# Patient Record
Sex: Female | Born: 1990 | Race: Black or African American | Hispanic: No | Marital: Single | Smoking: Current every day smoker
Health system: Southern US, Community
[De-identification: ages and names within clinical notes are randomized; demographics above are authoritative.]

## PROBLEM LIST (undated history)

## (undated) DIAGNOSIS — W3400XA Accidental discharge from unspecified firearms or gun, initial encounter: Secondary | ICD-10-CM

## (undated) DIAGNOSIS — Z8614 Personal history of Methicillin resistant Staphylococcus aureus infection: Secondary | ICD-10-CM

## (undated) HISTORY — PX: OTHER SURGICAL HISTORY: SHX169

---
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
## 2011-04-27 LAB — CBC
HCT: 32.1 — ABNORMAL LOW
Hemoglobin: 10 — ABNORMAL LOW
MCHC: 34.9
MCV: 80.9
Platelets: 243
RDW: 13
RDW: 13.2
WBC: 10.1

## 2011-04-27 LAB — RPR: RPR Ser Ql: NONREACTIVE

## 2011-05-11 LAB — URINALYSIS, ROUTINE W REFLEX MICROSCOPIC
Nitrite: NEGATIVE
Protein, ur: NEGATIVE
Specific Gravity, Urine: 1.01
Urobilinogen, UA: 0.2

## 2011-05-11 LAB — DIFFERENTIAL
Eosinophils Absolute: 0.2
Eosinophils Relative: 2
Lymphs Abs: 1.9
Monocytes Absolute: 0.5
Monocytes Relative: 6

## 2011-05-11 LAB — SICKLE CELL SCREEN: Sickle Cell Screen: POSITIVE — AB

## 2011-05-11 LAB — URINE MICROSCOPIC-ADD ON

## 2011-05-11 LAB — CBC
HCT: 33.1
Hemoglobin: 11.6
MCV: 88
RBC: 3.75 — ABNORMAL LOW
WBC: 8.7

## 2011-05-11 LAB — URINE CULTURE: Culture: NO GROWTH

## 2011-05-11 LAB — ABO/RH: ABO/RH(D): O POS

## 2011-05-11 LAB — GC/CHLAMYDIA PROBE AMP, GENITAL
Chlamydia, DNA Probe: POSITIVE — AB
GC Probe Amp, Genital: NEGATIVE

## 2011-05-11 LAB — TYPE AND SCREEN: ABO/RH(D): O POS

## 2011-05-11 LAB — WET PREP, GENITAL
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
## 2011-10-30 ENCOUNTER — Encounter (HOSPITAL_COMMUNITY): Payer: Self-pay | Admitting: Emergency Medicine

## 2011-10-30 ENCOUNTER — Emergency Department (HOSPITAL_COMMUNITY)
Admission: EM | Admit: 2011-10-30 | Discharge: 2011-10-31 | Disposition: A | Discharge status: Home / Self Care | Payer: Self-pay | Attending: Emergency Medicine | Admitting: Emergency Medicine

## 2011-10-30 DIAGNOSIS — R197 Diarrhea, unspecified: Secondary | ICD-10-CM | POA: Insufficient documentation

## 2011-10-30 DIAGNOSIS — R5381 Other malaise: Secondary | ICD-10-CM | POA: Insufficient documentation

## 2011-10-30 DIAGNOSIS — R112 Nausea with vomiting, unspecified: Secondary | ICD-10-CM | POA: Insufficient documentation

## 2011-10-30 DIAGNOSIS — F17200 Nicotine dependence, unspecified, uncomplicated: Secondary | ICD-10-CM | POA: Insufficient documentation

## 2011-10-30 HISTORY — DX: Accidental discharge from unspecified firearms or gun, initial encounter: W34.00XA

## 2011-10-30 LAB — CBC
MCH: 29.2 pg (ref 26.0–34.0)
MCHC: 35.9 g/dL (ref 30.0–36.0)
MCV: 81.2 fL (ref 78.0–100.0)
Platelets: 316 10*3/uL (ref 150–400)
RDW: 13.7 % (ref 11.5–15.5)
WBC: 9 10*3/uL (ref 4.0–10.5)

## 2011-10-30 LAB — BASIC METABOLIC PANEL
Calcium: 9.6 mg/dL (ref 8.4–10.5)
GFR calc non Af Amer: >90 mL/min (ref >90)
Sodium: 140 mEq/L (ref 135–145)

## 2011-10-30 LAB — DIFFERENTIAL
Basophils Absolute: 0.1 10*3/uL (ref 0.0–0.1)
Basophils Relative: 1 % (ref 0–1)
Eosinophils Absolute: 0.1 10*3/uL (ref 0.0–0.7)
Eosinophils Relative: 1 % (ref 0–5)

## 2011-10-30 NOTE — ED Notes (Signed)
PT. REPORTS EMESIS AND DIARRHEA WITH SLIGHT SOB ONSET TODAY WITH GENERALIZED WEAKNESS AND CHILLS.

## 2011-10-31 ENCOUNTER — Encounter (HOSPITAL_COMMUNITY): Payer: Self-pay | Admitting: Emergency Medicine

## 2011-10-31 LAB — URINALYSIS, ROUTINE W REFLEX MICROSCOPIC
Ketones, ur: 15 mg/dL — AB
Protein, ur: NEGATIVE mg/dL
Urobilinogen, UA: 0.2 mg/dL (ref 0.0–1.0)

## 2011-10-31 LAB — POCT PREGNANCY, URINE: Preg Test, Ur: NEGATIVE

## 2011-10-31 LAB — HEPATIC FUNCTION PANEL
ALT: 15 U/L (ref 0–35)
Bilirubin, Direct: 0.1 mg/dL (ref 0.0–0.3)
Indirect Bilirubin: 0.3 mg/dL (ref 0.3–0.9)
Total Bilirubin: 0.4 mg/dL (ref 0.3–1.2)

## 2011-10-31 LAB — URINE MICROSCOPIC-ADD ON

## 2011-10-31 MED ORDER — ONDANSETRON HCL 4 MG PO TABS: 4.0000 mg | ORAL_TABLET | Freq: Four times a day (QID) | ORAL | Status: AC | PRN

## 2011-10-31 NOTE — ED Notes (Signed)
Waiting for lab work
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
## 2011-10-31 NOTE — Discharge Instructions (Signed)
RESOURCE GUIDE  Dental Problems  Patients with Medicaid: [facility]                     [facility] [address]                                           [address] Phone:  [phone]                                                  Phone:  [phone]  If unable to pay or uninsured, contact:  [facility] or [facility] to become qualified for the adult dental clinic.  Chronic Pain Problems Contact [facility]  [phone] Patients need to be referred by their primary care doctor.  Insufficient Money for Medicine Contact [employer]:  call "211" or [facility] 271-5999.  No Primary Care Doctor Call [facility]  832-8000 Other agencies that provide inexpensive medical care    [facility]  [phone]    [facility]  [phone]    [facility]  [phone]    Women[facility] Clinic  [phone]    [facility]  [phone]    [facility]  [phone]  Psychological Services [facility]  [phone] [facility]  [phone] [facility]   [phone] (emergency services [phone])  Substance Abuse Resources [facility]  336-882-2125 [facility] 336-784-9470 [facility] 336-285-9073 [facility] 336-845-3988 Residential & Outpatient Substance Abuse Program  800-659-3381  Abuse/Neglect [place] Child Abuse Hotline (336) 641-3795 [place] Child Abuse Hotline 800-378-5315 (After Hours)  Emergency Shelter [facility] (336) 271-5985  Maternity Homes [facility] (336) 275-9566 [facility] (704) 372-4663  MRSA Hotline #:   832-7006    [place] [facility]  [facility] [place] [facility]     [employer]                          [facility] [address] [place]                       [address]      [address]  [place]                                                [place]                            [place] Phone:  [phone]                                   Phone:  [phone]                 Phone:  [phone]  [facility] Phone:  [phone]    [facility] Child Abuse Hotline (364)866-4343 431-789-9228 (After Hours)   Take the prescription as directed.  Increase your fluid intake (ie:  Gatoraide) for the next few days, as discussed.  Eat a bland diet and advance to your regular diet slowly as you can tolerate it.   Avoid full strength juices, as well as milk and milk products until your diarrhea has resolved.   Call your regular medical doctor and your OB/GYN doctor on Monday to schedule a follow up appointment this week.  Return to the Emergency Department immediately if not improving (or even worsening) despite taking the medicines as prescribed, any black or bloody stool or vomit, if you develop a fever, or for any other concerns.
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
## 2012-12-11 ENCOUNTER — Emergency Department (HOSPITAL_COMMUNITY)
Admission: EM | Admit: 2012-12-11 | Discharge: 2012-12-12 | Disposition: A | Discharge status: Home / Self Care | Payer: Medicaid Other | Attending: Emergency Medicine | Admitting: Emergency Medicine

## 2012-12-11 DIAGNOSIS — J029 Acute pharyngitis, unspecified: Secondary | ICD-10-CM | POA: Insufficient documentation

## 2012-12-11 DIAGNOSIS — Z3202 Encounter for pregnancy test, result negative: Secondary | ICD-10-CM | POA: Insufficient documentation

## 2012-12-11 DIAGNOSIS — F17200 Nicotine dependence, unspecified, uncomplicated: Secondary | ICD-10-CM | POA: Insufficient documentation

## 2012-12-11 DIAGNOSIS — Z87828 Personal history of other (healed) physical injury and trauma: Secondary | ICD-10-CM | POA: Insufficient documentation
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
## 2012-12-12 LAB — URINE MICROSCOPIC-ADD ON

## 2012-12-12 LAB — URINALYSIS, ROUTINE W REFLEX MICROSCOPIC
Bilirubin Urine: NEGATIVE
Glucose, UA: NEGATIVE mg/dL
Ketones, ur: NEGATIVE mg/dL
Leukocytes, UA: NEGATIVE
Nitrite: NEGATIVE
Protein, ur: NEGATIVE mg/dL
Specific Gravity, Urine: 1.026 (ref 1.005–1.030)
Urobilinogen, UA: 1 mg/dL (ref 0.0–1.0)
pH: 6 (ref 5.0–8.0)

## 2012-12-12 LAB — RAPID STREP SCREEN (MED CTR MEBANE ONLY): Streptococcus, Group A Screen (Direct): NEGATIVE

## 2012-12-12 LAB — PREGNANCY, URINE: Preg Test, Ur: NEGATIVE

## 2012-12-12 MED ORDER — IBUPROFEN 800 MG PO TABS
800.0000 mg | ORAL_TABLET | Freq: Once | ORAL | Status: AC
  Administered 2012-12-12: 800 mg via ORAL
  Filled 2012-12-12: qty 1

## 2012-12-12 MED ORDER — ACETAMINOPHEN-CODEINE 120-12 MG/5ML PO SOLN: 10.0000 mL | ORAL | Status: DC | PRN

## 2012-12-12 MED ORDER — SULFAMETHOXAZOLE-TRIMETHOPRIM 800-160 MG PO TABS: 2.0000 | ORAL_TABLET | Freq: Two times a day (BID) | ORAL | Status: DC

## 2012-12-12 NOTE — ED Provider Notes (Signed)
Medical screening examination/treatment/procedure(s) were performed by non-physician practitioner and as supervising physician I was immediately available for consultation/collaboration.   Jamirah Zelaya L Alyha Marines, MD 12/12/12 0709 

## 2013-01-10 ENCOUNTER — Encounter (HOSPITAL_COMMUNITY): Payer: Self-pay | Admitting: Emergency Medicine

## 2013-01-10 ENCOUNTER — Emergency Department (HOSPITAL_COMMUNITY): Payer: Medicaid Other

## 2013-01-10 ENCOUNTER — Emergency Department (HOSPITAL_COMMUNITY)
Admission: EM | Admit: 2013-01-10 | Discharge: 2013-01-10 | Disposition: A | Discharge status: Home / Self Care | Payer: Medicaid Other | Attending: Emergency Medicine | Admitting: Emergency Medicine

## 2013-01-10 DIAGNOSIS — S6990XA Unspecified injury of unspecified wrist, hand and finger(s), initial encounter: Secondary | ICD-10-CM | POA: Insufficient documentation

## 2013-01-10 DIAGNOSIS — Y929 Unspecified place or not applicable: Secondary | ICD-10-CM | POA: Insufficient documentation

## 2013-01-10 DIAGNOSIS — S6980XA Other specified injuries of unspecified wrist, hand and finger(s), initial encounter: Secondary | ICD-10-CM | POA: Insufficient documentation

## 2013-01-10 DIAGNOSIS — Z8614 Personal history of Methicillin resistant Staphylococcus aureus infection: Secondary | ICD-10-CM | POA: Insufficient documentation

## 2013-01-10 DIAGNOSIS — Y939 Activity, unspecified: Secondary | ICD-10-CM | POA: Insufficient documentation

## 2013-01-10 DIAGNOSIS — Z87828 Personal history of other (healed) physical injury and trauma: Secondary | ICD-10-CM | POA: Insufficient documentation

## 2013-01-10 DIAGNOSIS — S6992XA Unspecified injury of left wrist, hand and finger(s), initial encounter: Secondary | ICD-10-CM

## 2013-01-10 DIAGNOSIS — W230XXA Caught, crushed, jammed, or pinched between moving objects, initial encounter: Secondary | ICD-10-CM | POA: Insufficient documentation

## 2013-01-10 DIAGNOSIS — F17200 Nicotine dependence, unspecified, uncomplicated: Secondary | ICD-10-CM | POA: Insufficient documentation

## 2013-01-10 MED ORDER — HYDROCODONE-ACETAMINOPHEN 5-325 MG PO TABS
1.0000 | ORAL_TABLET | ORAL | Status: DC | PRN
Start: 2013-01-10 — End: 2013-03-15

## 2013-01-10 MED ORDER — HYDROCODONE-ACETAMINOPHEN 5-325 MG PO TABS
1.0000 | ORAL_TABLET | Freq: Once | ORAL | Status: AC
  Administered 2013-01-10: 1 via ORAL
  Filled 2013-01-10: qty 1

## 2013-01-10 NOTE — ED Notes (Signed)
Bacitracin and band-aid applied to wound. Pt instructed on wound care.
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
## 2013-01-11 NOTE — ED Provider Notes (Signed)
Medical screening examination/treatment/procedure(s) were performed by non-physician practitioner and as supervising physician I was immediately available for consultation/collaboration.  Toy Baker, MD 01/11/13 1311

## 2013-03-15 ENCOUNTER — Encounter (HOSPITAL_COMMUNITY): Payer: Self-pay | Admitting: Emergency Medicine

## 2013-03-15 ENCOUNTER — Other Ambulatory Visit (HOSPITAL_COMMUNITY)
Admission: RE | Admit: 2013-03-15 | Discharge: 2013-03-15 | Disposition: A | Discharge status: Home / Self Care | Payer: Medicaid Other | Source: Ambulatory Visit | Attending: Emergency Medicine | Admitting: Emergency Medicine

## 2013-03-15 ENCOUNTER — Emergency Department (HOSPITAL_COMMUNITY)
Admission: EM | Admit: 2013-03-15 | Discharge: 2013-03-15 | Disposition: A | Discharge status: Nursing Home (Includes ICF) | Payer: Medicaid Other | Source: Home / Self Care | Attending: Emergency Medicine | Admitting: Emergency Medicine

## 2013-03-15 DIAGNOSIS — Y939 Activity, unspecified: Secondary | ICD-10-CM | POA: Insufficient documentation

## 2013-03-15 DIAGNOSIS — W3400XA Accidental discharge from unspecified firearms or gun, initial encounter: Secondary | ICD-10-CM | POA: Insufficient documentation

## 2013-03-15 DIAGNOSIS — Z113 Encounter for screening for infections with a predominantly sexual mode of transmission: Secondary | ICD-10-CM | POA: Insufficient documentation

## 2013-03-15 DIAGNOSIS — N949 Unspecified condition associated with female genital organs and menstrual cycle: Secondary | ICD-10-CM

## 2013-03-15 DIAGNOSIS — Y929 Unspecified place or not applicable: Secondary | ICD-10-CM | POA: Insufficient documentation

## 2013-03-15 DIAGNOSIS — F17200 Nicotine dependence, unspecified, uncomplicated: Secondary | ICD-10-CM | POA: Insufficient documentation

## 2013-03-15 DIAGNOSIS — T148XXA Other injury of unspecified body region, initial encounter: Secondary | ICD-10-CM | POA: Insufficient documentation

## 2013-03-15 DIAGNOSIS — Z8614 Personal history of Methicillin resistant Staphylococcus aureus infection: Secondary | ICD-10-CM | POA: Insufficient documentation

## 2013-03-15 DIAGNOSIS — Z79899 Other long term (current) drug therapy: Secondary | ICD-10-CM | POA: Insufficient documentation

## 2013-03-15 DIAGNOSIS — R102 Pelvic and perineal pain: Secondary | ICD-10-CM

## 2013-03-15 DIAGNOSIS — J029 Acute pharyngitis, unspecified: Secondary | ICD-10-CM

## 2013-03-15 DIAGNOSIS — N760 Acute vaginitis: Secondary | ICD-10-CM | POA: Insufficient documentation

## 2013-03-15 DIAGNOSIS — L089 Local infection of the skin and subcutaneous tissue, unspecified: Secondary | ICD-10-CM | POA: Insufficient documentation

## 2013-03-15 DIAGNOSIS — S40821A Blister (nonthermal) of right upper arm, initial encounter: Secondary | ICD-10-CM

## 2013-03-15 DIAGNOSIS — S40229A Blister (nonthermal) of unspecified shoulder, initial encounter: Secondary | ICD-10-CM

## 2013-03-15 HISTORY — DX: Personal history of Methicillin resistant Staphylococcus aureus infection: Z86.14

## 2013-03-15 MED ORDER — LIDOCAINE HCL (PF) 1 % IJ SOLN
INTRAMUSCULAR | Status: AC
  Filled 2013-03-15: qty 5

## 2013-03-15 MED ORDER — CEFTRIAXONE SODIUM 250 MG IJ SOLR
INTRAMUSCULAR | Status: AC
  Filled 2013-03-15: qty 250

## 2013-03-15 MED ORDER — AZITHROMYCIN 250 MG PO TABS
ORAL_TABLET | ORAL | Status: AC
  Filled 2013-03-15: qty 4

## 2013-03-15 MED ORDER — CEFTRIAXONE SODIUM 250 MG IJ SOLR
250.0000 mg | Freq: Once | INTRAMUSCULAR | Status: AC
  Administered 2013-03-15: 250 mg via INTRAMUSCULAR

## 2013-03-15 MED ORDER — AZITHROMYCIN 250 MG PO TABS
1000.0000 mg | ORAL_TABLET | Freq: Once | ORAL | Status: AC
  Administered 2013-03-15: 1000 mg via ORAL
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
## 2013-03-15 NOTE — ED Notes (Signed)
Patient has multiple complaints.  Patient reports sore throat for 2 days, patient concerned for std.  Reports abdominal pain and vaginal discharge.  Patient also concerned for right upper arm infection.  Reports finished ?septra? Last week.  Reports she is supposed to have another surgery in regards to right upper arm/mrsa infection.

## 2013-03-15 NOTE — ED Notes (Signed)
Patient is aware of post injection delay prior to discharge.  

## 2013-03-15 NOTE — ED Notes (Signed)
PT. REPORTS SORE THROAT . VAGINAL DISCHARGE / PELVIC DISCOMFORT AND RIGHT UPPER ARM WOUND INFECTION WITH DRAINAGE , SEEN AT [facility] TODAY - NEGATIVE STREP SCREEN / RECEIVED ROCEPHIN 250 MG IM AND ZITHROMAX 1 GRAM PO AT URGENT CARE , SENT HERE FOR FURTHER EVALUATION OF RIGHT UPPER ARM WOUND INFECTION .

## 2013-03-15 NOTE — ED Notes (Signed)
NURSE FIRST ROUNDS : NURSE EXPLAINED DELAY , WAIT TIME AND PROCESS TO PT. DENIES PAIN AT THIS TIME / RESPIRATIONS UNLABORED.

## 2013-03-16 ENCOUNTER — Telehealth (HOSPITAL_COMMUNITY): Payer: Self-pay | Admitting: *Deleted

## 2013-03-16 ENCOUNTER — Emergency Department (HOSPITAL_COMMUNITY): Payer: Medicaid Other

## 2013-03-16 ENCOUNTER — Emergency Department (HOSPITAL_COMMUNITY)
Admission: EM | Admit: 2013-03-16 | Discharge: 2013-03-16 | Disposition: A | Discharge status: Home / Self Care | Payer: Medicaid Other | Attending: Emergency Medicine | Admitting: Emergency Medicine

## 2013-03-16 DIAGNOSIS — IMO0002 Reserved for concepts with insufficient information to code with codable children: Secondary | ICD-10-CM

## 2013-03-16 LAB — CBC WITH DIFFERENTIAL/PLATELET
Hemoglobin: 14.1 g/dL (ref 12.0–15.0)
Lymphocytes Relative: 38 % (ref 12–46)
Lymphs Abs: 3.8 10*3/uL (ref 0.7–4.0)
MCH: 29.4 pg (ref 26.0–34.0)
MCV: 82 fL (ref 78.0–100.0)
Monocytes Relative: 9 % (ref 3–12)
Neutrophils Relative %: 50 % (ref 43–77)
Platelets: 246 10*3/uL (ref 150–400)
RBC: 4.79 MIL/uL (ref 3.87–5.11)
WBC: 9.9 10*3/uL (ref 4.0–10.5)

## 2013-03-16 LAB — BASIC METABOLIC PANEL
BUN: 9 mg/dL (ref 6–23)
CO2: 22 mEq/L (ref 19–32)
Glucose, Bld: 61 mg/dL — ABNORMAL LOW (ref 70–99)
Potassium: 4.6 mEq/L (ref 3.5–5.1)
Sodium: 133 mEq/L — ABNORMAL LOW (ref 135–145)

## 2013-03-16 LAB — GLUCOSE, CAPILLARY: Glucose-Capillary: 155 mg/dL — ABNORMAL HIGH (ref 70–99)

## 2013-03-16 MED ORDER — IBUPROFEN 400 MG PO TABS
600.0000 mg | ORAL_TABLET | Freq: Once | ORAL | Status: AC
  Administered 2013-03-16: 600 mg via ORAL
  Filled 2013-03-16: qty 1
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
## 2013-03-17 LAB — CULTURE, GROUP A STREP

## 2013-03-18 ENCOUNTER — Telehealth (HOSPITAL_COMMUNITY): Payer: Self-pay | Admitting: *Deleted

## 2013-03-18 MED ORDER — METRONIDAZOLE 500 MG PO TABS: 500.0000 mg | ORAL_TABLET | Freq: Two times a day (BID) | ORAL | Status: DC

## 2013-03-18 NOTE — ED Notes (Signed)
GC/Chlamydia neg., Affirm: Candida and Trich neg., Gardnerella pos., Throat culture: No beta hemolytic strep isolated.  8/16 Message to Dr. Ladon Applebaum.  He wrote Flagyl if pt. Symptomatic.  I called pt. and left a message to call. Abigail Meyer 03/18/2013

## 2013-03-18 NOTE — ED Notes (Signed)
Pt. called back.  Pt. verified x 2 and given results.  I asked pt. If she still had symptoms.  She said no, but she is having her period and can't tell. I told her if she still has the yellowish discharge after her period is over to call back. I told her the doctor said it only needs treatment if she is having symptoms. Pt.'s questions about bacterial vaginosis answered.  Pt. voiced understanding.

## 2013-03-19 ENCOUNTER — Telehealth (HOSPITAL_COMMUNITY): Payer: Self-pay | Admitting: *Deleted

## 2013-03-20 LAB — WOUND CULTURE: Special Requests: NORMAL

## 2013-03-20 NOTE — ED Notes (Signed)
Post ED Visit - Positive Culture Follow-up: Successful Patient Follow-Up  Culture assessed and recommendations reviewed by: []  Wes Dulaney, Pharm.D., BCPS [x]  Celedonio Miyamoto, [address].D., BCPS []  Georgina Pillion, [address].D., BCPS []  [place], [place].D., BCPS, AAHIVP []  Estella Husk, Pharm.D., BCPS, AAHIVP  Positive wound culture  []  Patient discharged without antimicrobial prescription and treatment is now indicated [x]  Organism is resistant to prescribed ED discharge antimicrobial []  Patient with positive blood cultures  Changes discussed with ED provider: Pascal Lux [place] New antibiotic prescription Keflex 500 mg po QID x 7 days for arm infection     Larena Sox 03/20/2013, 1:22 PM

## 2013-03-20 NOTE — Progress Notes (Signed)
ED Antimicrobial Stewardship Positive Culture Follow Up   Abigail Meyer is an 22 y.o. female who presented to [facility] on 03/15/2013 with a chief complaint of sore throat and vaginal discharge.  Recent Results (from the past 720 hour(s))  CULTURE, GROUP A STREP     Status: None   Collection Time    03/15/13  7:33 PM      Result Value Range Status   Specimen Description THROAT   Final   Special Requests NONE   Final   Culture     Final   Value: No Beta Hemolytic Streptococci Isolated     Performed at [facility]   Report Status 03/17/2013 FINAL   Final  WOUND CULTURE     Status: None   Collection Time    03/16/13  2:13 AM      Result Value Range Status   Specimen Description WOUND RIGHT UPPER ARM   Final   Special Requests Normal   Final   Gram Stain     Final   Value: MODERATE WBC PRESENT, PREDOMINANTLY PMN     NO SQUAMOUS EPITHELIAL CELLS SEEN     NO ORGANISMS SEEN     Performed at [facility]   Culture     Final   Value: FEW STAPHYLOCOCCUS AUREUS     Note: RIFAMPIN AND GENTAMICIN SHOULD NOT BE USED AS SINGLE DRUGS FOR TREATMENT OF STAPH INFECTIONS. This organism is presumed to be Clindamycin resistant based on detection of inducible Clindamycin resistance.     Performed at [facility]   Report Status 03/20/2013 FINAL   Final   Organism ID, Bacteria STAPHYLOCOCCUS AUREUS   Final  CULTURE, BLOOD (ROUTINE X 2)     Status: None   Collection Time    03/16/13  3:00 AM      Result Value Range Status   Specimen Description BLOOD RIGHT ARM   Final   Special Requests BOTTLES DRAWN AEROBIC ONLY 5CC   Final   Culture  Setup Time     Final   Value: 03/16/2013 08:50     Performed at [facility]   Culture     Final   Value:        BLOOD CULTURE RECEIVED NO GROWTH TO DATE CULTURE WILL BE HELD FOR 5 DAYS BEFORE ISSUING A FINAL NEGATIVE REPORT     Performed at [facility]   Report Status PENDING   Incomplete  CULTURE, BLOOD (ROUTINE X 2)      Status: None   Collection Time    03/16/13  3:25 AM      Result Value Range Status   Specimen Description BLOOD LEFT ARM   Final   Special Requests BOTTLES DRAWN AEROBIC ONLY 10CC   Final   Culture  Setup Time     Final   Value: 03/16/2013 08:51     Performed at [facility]   Culture     Final   Value:        BLOOD CULTURE RECEIVED NO GROWTH TO DATE CULTURE WILL BE HELD FOR 5 DAYS BEFORE ISSUING A FINAL NEGATIVE REPORT     Performed at [facility]   Report Status PENDING   Incomplete     [x]  Patient discharged originally without antimicrobial agent and treatment is now indicated.  Patient has a chronic arm wound.  Purulent material found upon examination in the ED.  Per chart, patient has a history of MRSA,  but this isolate is MSSA.  New antibiotic prescription: Keflex 500mg  po QID x 7 days for arm infection  ED Provider: Pascal Lux [facility]   Abigail Meyer 03/20/2013, 9:48 AM Infectious Diseases Pharmacist Phone# [phone]

## 2013-03-22 LAB — CULTURE, BLOOD (ROUTINE X 2)
Culture: NO GROWTH
Culture: NO GROWTH

## 2013-11-07 ENCOUNTER — Emergency Department (HOSPITAL_COMMUNITY)
Admission: EM | Admit: 2013-11-07 | Discharge: 2013-11-08 | Disposition: A | Discharge status: Home / Self Care | Payer: Medicaid Other | Attending: Emergency Medicine | Admitting: Emergency Medicine

## 2013-11-07 DIAGNOSIS — F17200 Nicotine dependence, unspecified, uncomplicated: Secondary | ICD-10-CM | POA: Insufficient documentation

## 2013-11-07 DIAGNOSIS — Y92410 Unspecified street and highway as the place of occurrence of the external cause: Secondary | ICD-10-CM | POA: Insufficient documentation

## 2013-11-07 DIAGNOSIS — Z043 Encounter for examination and observation following other accident: Secondary | ICD-10-CM

## 2013-11-07 DIAGNOSIS — R51 Headache: Secondary | ICD-10-CM

## 2013-11-07 DIAGNOSIS — S3981XA Other specified injuries of abdomen, initial encounter: Secondary | ICD-10-CM | POA: Insufficient documentation

## 2013-11-07 DIAGNOSIS — Y9389 Activity, other specified: Secondary | ICD-10-CM | POA: Insufficient documentation

## 2013-11-07 DIAGNOSIS — Z041 Encounter for examination and observation following transport accident: Secondary | ICD-10-CM

## 2013-11-07 DIAGNOSIS — S0990XA Unspecified injury of head, initial encounter: Secondary | ICD-10-CM | POA: Insufficient documentation

## 2013-11-07 DIAGNOSIS — R52 Pain, unspecified: Secondary | ICD-10-CM

## 2013-11-07 DIAGNOSIS — R519 Headache, unspecified: Secondary | ICD-10-CM

## 2013-11-07 DIAGNOSIS — Z8614 Personal history of Methicillin resistant Staphylococcus aureus infection: Secondary | ICD-10-CM | POA: Insufficient documentation

## 2013-11-07 DIAGNOSIS — IMO0002 Reserved for concepts with insufficient information to code with codable children: Secondary | ICD-10-CM | POA: Insufficient documentation
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
## 2013-11-08 MED ORDER — NAPROXEN 500 MG PO TABS: 500.0000 mg | ORAL_TABLET | Freq: Two times a day (BID) | ORAL | Status: DC
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
## 2013-11-09 NOTE — ED Provider Notes (Signed)
Medical screening examination/treatment/procedure(s) were performed by non-physician practitioner and as supervising physician I was immediately available for consultation/collaboration.   EKG Interpretation None       Sunnie NielsenBrian Senie Lanese, MD 11/09/13 [phone]

## 2013-11-12 ENCOUNTER — Encounter (HOSPITAL_COMMUNITY): Payer: Self-pay | Admitting: Emergency Medicine

## 2013-11-12 ENCOUNTER — Emergency Department (INDEPENDENT_AMBULATORY_CARE_PROVIDER_SITE_OTHER)
Admission: EM | Admit: 2013-11-12 | Discharge: 2013-11-12 | Disposition: A | Discharge status: Home / Self Care | Payer: Self-pay | Source: Home / Self Care | Attending: Family Medicine | Admitting: Family Medicine

## 2013-11-12 ENCOUNTER — Other Ambulatory Visit (HOSPITAL_COMMUNITY)
Admission: RE | Admit: 2013-11-12 | Discharge: 2013-11-12 | Disposition: A | Discharge status: Home / Self Care | Payer: Self-pay | Source: Ambulatory Visit | Attending: Family Medicine | Admitting: Family Medicine

## 2013-11-12 DIAGNOSIS — N760 Acute vaginitis: Secondary | ICD-10-CM | POA: Insufficient documentation

## 2013-11-12 DIAGNOSIS — Z113 Encounter for screening for infections with a predominantly sexual mode of transmission: Secondary | ICD-10-CM | POA: Insufficient documentation

## 2013-11-12 DIAGNOSIS — B9689 Other specified bacterial agents as the cause of diseases classified elsewhere: Secondary | ICD-10-CM

## 2013-11-12 DIAGNOSIS — A499 Bacterial infection, unspecified: Secondary | ICD-10-CM

## 2013-11-12 LAB — POCT URINALYSIS DIP (DEVICE)
BILIRUBIN URINE: NEGATIVE
GLUCOSE, UA: NEGATIVE mg/dL
KETONES UR: NEGATIVE mg/dL
Nitrite: NEGATIVE
PH: 5.5 (ref 5.0–8.0)
Protein, ur: NEGATIVE mg/dL
Specific Gravity, Urine: >=1.03 (ref 1.005–1.030)
Urobilinogen, UA: 0.2 mg/dL (ref 0.0–1.0)

## 2013-11-12 LAB — POCT PREGNANCY, URINE: Preg Test, Ur: NEGATIVE

## 2013-11-12 MED ORDER — METRONIDAZOLE 500 MG PO TABS: 500.0000 mg | ORAL_TABLET | Freq: Two times a day (BID) | ORAL | Status: DC

## 2013-11-12 NOTE — Discharge Instructions (Signed)
Use medicine as prescribed, we will call if tests show a need for other treatment.  
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
## 2013-11-12 NOTE — ED Notes (Signed)
C/o  Vaginal discharge with odor and irritation.  Pelvic pain.  Symptoms present x 1 1/2 wks.  No otc treatment tried.  Concerns for std"s.

## 2013-11-13 LAB — CERVICOVAGINAL ANCILLARY ONLY
CHLAMYDIA, DNA PROBE: NEGATIVE
NEISSERIA GONORRHEA: NEGATIVE
WET PREP (BD AFFIRM): NEGATIVE
WET PREP (BD AFFIRM): POSITIVE — AB
Wet Prep (BD Affirm): NEGATIVE

## 2013-11-14 NOTE — ED Notes (Signed)
GC/Chlamydia neg., Affirm: Candida and Trich neg., Gardnerella pos. Pt. adequately treated with Flagyl. Abigail Meyer 11/14/2013  

## 2013-11-24 ENCOUNTER — Telehealth (HOSPITAL_COMMUNITY): Payer: Self-pay | Admitting: *Deleted

## 2013-11-24 NOTE — ED Notes (Signed)
Pt. called for lab results.  Pt.'s number taken by Hinda KehrMaranda.  I called pt. and left a message to call. Call 1.

## 2013-11-24 NOTE — ED Notes (Signed)
Pt. called back.  Pt. verified x 2 and given results.  Pt. told she was adequately treated for bacterial vaginosis with Flagyl.  Pt. voiced understanding. Desiree LucySuzanne M Shannon [facility] 11/24/2013

## 2014-04-23 ENCOUNTER — Emergency Department (HOSPITAL_COMMUNITY)
Admission: EM | Admit: 2014-04-23 | Discharge: 2014-04-23 | Disposition: A | Discharge status: Home / Self Care | Payer: No Typology Code available for payment source | Attending: Emergency Medicine | Admitting: Emergency Medicine

## 2014-04-23 ENCOUNTER — Encounter (HOSPITAL_COMMUNITY): Payer: Self-pay | Admitting: Emergency Medicine

## 2014-04-23 DIAGNOSIS — Z87828 Personal history of other (healed) physical injury and trauma: Secondary | ICD-10-CM | POA: Insufficient documentation

## 2014-04-23 DIAGNOSIS — Z791 Long term (current) use of non-steroidal anti-inflammatories (NSAID): Secondary | ICD-10-CM | POA: Insufficient documentation

## 2014-04-23 DIAGNOSIS — S41101D Unspecified open wound of right upper arm, subsequent encounter: Secondary | ICD-10-CM

## 2014-04-23 DIAGNOSIS — G8911 Acute pain due to trauma: Secondary | ICD-10-CM | POA: Insufficient documentation

## 2014-04-23 DIAGNOSIS — Z8614 Personal history of Methicillin resistant Staphylococcus aureus infection: Secondary | ICD-10-CM | POA: Insufficient documentation

## 2014-04-23 DIAGNOSIS — Z792 Long term (current) use of antibiotics: Secondary | ICD-10-CM | POA: Insufficient documentation

## 2014-04-23 DIAGNOSIS — M79609 Pain in unspecified limb: Secondary | ICD-10-CM | POA: Insufficient documentation

## 2014-04-23 DIAGNOSIS — F17200 Nicotine dependence, unspecified, uncomplicated: Secondary | ICD-10-CM | POA: Insufficient documentation

## 2014-04-23 MED ORDER — CEPHALEXIN 500 MG PO CAPS: 500.0000 mg | ORAL_CAPSULE | Freq: Four times a day (QID) | ORAL | Status: DC

## 2014-04-23 MED ORDER — TRAMADOL HCL 50 MG PO TABS: 50.0000 mg | ORAL_TABLET | Freq: Four times a day (QID) | ORAL | Status: DC | PRN

## 2014-04-23 MED ORDER — SULFAMETHOXAZOLE-TRIMETHOPRIM 800-160 MG PO TABS: 1.0000 | ORAL_TABLET | Freq: Two times a day (BID) | ORAL | Status: DC
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
## 2014-04-26 NOTE — ED Provider Notes (Signed)
Medical screening examination/treatment/procedure(s) were performed by non-physician practitioner and as supervising physician I was immediately available for consultation/collaboration.   EKG Interpretation None       Doug Sou, MD 04/26/14 0010

## 2014-07-22 ENCOUNTER — Emergency Department (HOSPITAL_COMMUNITY)
Admission: EM | Admit: 2014-07-22 | Discharge: 2014-07-22 | Disposition: A | Discharge status: Home / Self Care | Payer: No Typology Code available for payment source | Attending: Emergency Medicine | Admitting: Emergency Medicine

## 2014-07-22 ENCOUNTER — Encounter (HOSPITAL_COMMUNITY): Payer: Self-pay | Admitting: *Deleted

## 2014-07-22 DIAGNOSIS — Z87828 Personal history of other (healed) physical injury and trauma: Secondary | ICD-10-CM | POA: Insufficient documentation

## 2014-07-22 DIAGNOSIS — Z720 Tobacco use: Secondary | ICD-10-CM | POA: Insufficient documentation

## 2014-07-22 DIAGNOSIS — R102 Pelvic and perineal pain: Secondary | ICD-10-CM | POA: Insufficient documentation

## 2014-07-22 DIAGNOSIS — Z792 Long term (current) use of antibiotics: Secondary | ICD-10-CM | POA: Insufficient documentation

## 2014-07-22 DIAGNOSIS — Z791 Long term (current) use of non-steroidal anti-inflammatories (NSAID): Secondary | ICD-10-CM | POA: Insufficient documentation

## 2014-07-22 DIAGNOSIS — Z8614 Personal history of Methicillin resistant Staphylococcus aureus infection: Secondary | ICD-10-CM | POA: Insufficient documentation

## 2014-07-22 DIAGNOSIS — Z3202 Encounter for pregnancy test, result negative: Secondary | ICD-10-CM | POA: Insufficient documentation

## 2014-07-22 LAB — URINALYSIS, ROUTINE W REFLEX MICROSCOPIC
Bilirubin Urine: NEGATIVE
Glucose, UA: NEGATIVE mg/dL
KETONES UR: NEGATIVE mg/dL
LEUKOCYTES UA: NEGATIVE
NITRITE: NEGATIVE
PROTEIN: NEGATIVE mg/dL
Specific Gravity, Urine: 1.022 (ref 1.005–1.030)
Urobilinogen, UA: 0.2 mg/dL (ref 0.0–1.0)
pH: 5.5 (ref 5.0–8.0)

## 2014-07-22 LAB — WET PREP, GENITAL
Trich, Wet Prep: NONE SEEN
Yeast Wet Prep HPF POC: NONE SEEN

## 2014-07-22 LAB — URINE MICROSCOPIC-ADD ON

## 2014-07-22 LAB — PREGNANCY, URINE: Preg Test, Ur: NEGATIVE

## 2014-07-22 MED ORDER — IBUPROFEN 600 MG PO TABS: 600.0000 mg | ORAL_TABLET | Freq: Four times a day (QID) | ORAL | Status: DC | PRN

## 2014-07-22 NOTE — Discharge Instructions (Signed)
Pelvic Pain °Female pelvic pain can be caused by many different things and start from a variety of places. Pelvic pain refers to pain that is located in the lower half of the abdomen and between your hips. The pain may occur over a short period of time (acute) or may be reoccurring (chronic). The cause of pelvic pain may be related to disorders affecting the female reproductive organs (gynecologic), but it may also be related to the bladder, kidney stones, an intestinal complication, or muscle or skeletal problems. Getting help right away for pelvic pain is important, especially if there has been severe, sharp, or a sudden onset of unusual pain. It is also important to get help right away because some types of pelvic pain can be life threatening.  °CAUSES  °Below are only some of the causes of pelvic pain. The causes of pelvic pain can be in one of several categories.  °· Gynecologic. °¨ Pelvic inflammatory disease. °¨ Sexually transmitted infection. °¨ Ovarian cyst or a twisted ovarian ligament (ovarian torsion). °¨ Uterine lining that grows outside the uterus (endometriosis). °¨ Fibroids, cysts, or tumors. °¨ Ovulation. °· Pregnancy. °¨ Pregnancy that occurs outside the uterus (ectopic pregnancy). °¨ Miscarriage. °¨ Labor. °¨ Abruption of the placenta or ruptured uterus. °· Infection. °¨ Uterine infection (endometritis). °¨ Bladder infection. °¨ Diverticulitis. °¨ Miscarriage related to a uterine infection (septic abortion). °· Bladder. °¨ Inflammation of the bladder (cystitis). °¨ Kidney stone(s). °· Gastrointestinal. °¨ Constipation. °¨ Diverticulitis. °· Neurologic. °¨ Trauma. °¨ Feeling pelvic pain because of mental or emotional causes (psychosomatic). °· Cancers of the bowel or pelvis. °EVALUATION  °Your caregiver will want to take a careful history of your concerns. This includes recent changes in your health, a careful gynecologic history of your periods (menses), and a sexual history. Obtaining your family  history and medical history is also important. Your caregiver may suggest a pelvic exam. A pelvic exam will help identify the location and severity of the pain. It also helps in the evaluation of which organ system may be involved. In order to identify the cause of the pelvic pain and be properly treated, your caregiver may order tests. These tests may include:  °· A pregnancy test. °· Pelvic ultrasonography. °· An X-ray exam of the abdomen. °· A urinalysis or evaluation of vaginal discharge. °· Blood tests. °HOME CARE INSTRUCTIONS  °· Only take over-the-counter or prescription medicines for pain, discomfort, or fever as directed by your caregiver.   °· Rest as directed by your caregiver.   °· Eat a balanced diet.   °· Drink enough fluids to make your urine clear or pale yellow, or as directed.   °· Avoid sexual intercourse if it causes pain.   °· Apply warm or cold compresses to the lower abdomen depending on which one helps the pain.   °· Avoid stressful situations.   °· Keep a journal of your pelvic pain. Write down when it started, where the pain is located, and if there are things that seem to be associated with the pain, such as food or your menstrual cycle. °· Follow up with your caregiver as directed.   °SEEK MEDICAL CARE IF: °· Your medicine does not help your pain. °· You have abnormal vaginal discharge. °SEEK IMMEDIATE MEDICAL CARE IF:  °· You have heavy bleeding from the vagina.   °· Your pelvic pain increases.   °· You feel light-headed or faint.   °· You have chills.   °· You have pain with urination or blood in your urine.   °· You have uncontrolled diarrhea   or vomiting.   °· You have a fever or persistent symptoms for more than 3 days. °· You have a fever and your symptoms suddenly get worse.   °· You are being physically or sexually abused.   °MAKE SURE YOU: °· Understand these instructions. °· Will watch your condition. °· Will get help if you are not doing well or get worse. °Document Released:  06/15/2004 Document Revised: 12/03/2013 Document Reviewed: 11/08/2011 °ExitCare® Patient Information ©2015 [employer]. This information is not intended to replace advice given to you by your health care provider. Make sure you discuss any questions you have with your health care provider. ° °Emergency Department Resource Guide °1) Find a Doctor and Pay Out of Pocket °Although you won't have to find out who is covered by your insurance plan, it is a good idea to ask around and get recommendations. You will then need to call the office and see if the doctor you have chosen will accept you as a new patient and what types of options they offer for patients who are self-pay. Some doctors offer discounts or will set up payment plans for their patients who do not have insurance, but you will need to ask so you aren't surprised when you get to your appointment. ° °2) Contact Your Local Health Department °Not all health departments have doctors that can see patients for sick visits, but many do, so it is worth a call to see if yours does. If you don't know where your local health department is, you can check in your phone book. The CDC also has a tool to help you locate your state's health department, and many state websites also have listings of all of their local health departments. ° °3) Find a Walk-in Clinic °If your illness is not likely to be very severe or complicated, you may want to try a walk in clinic. These are popping up all over the country in pharmacies, drugstores, and shopping centers. They're usually staffed by nurse practitioners or physician assistants that have been trained to treat common illnesses and complaints. They're usually fairly quick and inexpensive. However, if you have serious medical issues or chronic medical problems, these are probably not your best option. ° °No Primary Care Doctor: °- Call [facility] at  832-8000 - they can help you locate a primary care doctor that  accepts your  insurance, provides certain services, etc. °- Physician Referral Service- 1-800-533-3463 ° °Chronic Pain Problems: °Organization         Address  Phone   Notes  °[facility]  (336) 297-2271 Patients need to be referred by their primary care doctor.  ° °Medication Assistance: °Organization         Address  Phone   Notes  °[place] [facility] [address] °[place], [place] [address] (336) 641-8030 --Must be a resident of [place] °-- Must have NO insurance coverage whatsoever (no Medicaid/ Medicare, etc.) °-- The pt. MUST have a primary care doctor that directs their care regularly and follows them in the community °  °[facility]  (866) 331-1348   °[employer]  (888) 892-1162   ° °Agencies that provide inexpensive medical care: °Organization         Address  Phone   Notes  °[facility]  (336) 832-8035   °[facility]    (336) 832-7272   °[facility] [address] °[place], [place] [address] (336) 832-4777   °  [facility] [address], °[place] (336) 271-4999   °[facility]    (336) 373-0678   °[facility]    (336) 272-1050   °[facility] ° [address], [place] Phone:  (336) 832-4444, Fax:  (336) 832-4440 Hours of Operation:  9 am - 6 pm, M-F.  Also accepts Medicaid/Medicare and self-pay.  °[facility] ° [address], [place] Phone: (336) 832-3150, Fax: (336) 832-3151. Hours of Operation:  8:30 am - 5:30 pm, M-F.  Also accepts Medicaid and self-pay.  °[facility] [place] [address], [place] Phone: (336) 878-6027   °[facility] [address], [place], [place] (336)723-1848, Ext. 123 Mondays & Thursdays: 7-9 AM.  First 15 patients are seen on a first come, first serve basis. °  ° °Medicaid-accepting [place] Providers: ° °Organization          Address  Phone   Notes  °[facility] [address], [place] (336) 641-2100 Also accepts self-pay patients.  °[facility] [address], [place] ° (336) 856-9996   °[facility] [address], [place] (336) 288-8857   °[facility] [address] [place] [address], [place] (336) 299-7000   °Veita Bland [address], [place]  ° (336) 373-1557 Only accepts [place] Access Medicaid patients after they have their name applied to their card.  ° °Self-Pay (no insurance) in [place]: ° °Organization         Address  Phone   Notes  °Sickle Cell Patients, [facility] [address], [place] (336) 832-1970   °[facility] [address], [place] (336) 832-4400   ° [facility] ° [address], [place] (336) 992-4800   °[facility]/Dr. Osei-Bonsu ° [address] [place] [address], [place] or [address], [place] (336) 841-8500 Phone number for both [place] and [place] locations is the same.  °[facility] [address], [place] (336) 299-0000   °[facility] [address] [place] [address], [place] or [address] (336) 852-7530 °(336) 878-2260   °[facility] [address],  (336) 350-1642, phone; (336) 294-5005, fax Sees patients 1st and 3rd Saturday of every month.  Must not qualify for public or private insurance (i.e. Medicaid, Medicare, [place] Health Choice, Veterans' Benefits) • Household income should be no more than 200% of the poverty level •The clinic cannot treat you if you are pregnant or think you are pregnant • Sexually transmitted diseases are not treated at the clinic.  ° ° °Dental Care: °Organization         Address  Phone  Notes  °[facility] [address],   (336) 641-6152 Accepts children up to age 21 who are enrolled in Medicaid or [place] Health Choice; pregnant women with a Medicaid card; and children who have applied for Medicaid or [place] Health Choice, but were declined, whose parents can pay a reduced fee at time of service.  °[facility] [place]  [address], [place] (336) 641-7733 Accepts children up to age 21 who are enrolled in Medicaid or  Health Choice; pregnant women with a Medicaid card; and   children who have applied for Medicaid or [place] Health Choice, but were declined, whose parents can pay a reduced fee at time of service.  °[facility] ° [address], [place] (336) 641-4533 Patients are seen by appointment only. Walk-ins are not accepted. [facility] will see patients 18 years of age and older. °Monday - Tuesday (8am-5pm) °Most Wednesdays (8:30-5pm) °$30 per visit, cash only  °[facility] ° [address], [place] (336) 641-4533 Patients are seen by appointment only. Walk-ins are not accepted. [facility] will see patients 18 years of age and older. °One Wednesday Evening (Monthly: Volunteer Based).  $30 per visit, cash only  °[facility]  (919) 537-3737 for adults; Children under age 4, call Graduate Pediatric Dentistry at (919) [phone]. Children aged 4-14, please call (919) [phone] to request a pediatric application. ° Dental services are provided in all areas of dental care including fillings, crowns and bridges, complete and partial dentures, implants, gum treatment, root canals, and extractions. Preventive care is also provided. Treatment is provided to both adults and children. °Patients are selected via a lottery and there is often a waiting list. °  °[facility] [address], °[place] ° (336) 763-8833 [email or web address] °  °[facility] [address], [place], [place]  (336)723-1848, Ext. 123 Second and Fourth Thursday of each month, opens at 6:30 AM; Clinic ends at 9 AM.  Patients are seen on a first-come first-served basis, and a limited number are seen during each clinic.  ° °[facility] ° [address], [place], [place] (336) 723-7904   Eligibility Requirements °You must have lived in [place], [place], or [place] counties for at least the last three months. °  You cannot be eligible for state or federal sponsored healthcare insurance, including Veterans Administration, Medicaid, or Medicare. °  You generally cannot be eligible for healthcare insurance through your employer.  °  How to apply: °Eligibility screenings are held every Tuesday and Wednesday afternoon from 1:00 pm until 4:00 pm. You do not need an appointment for the interview!  °[facility] [address], [place], [place] 336-631-2330   °[facility]  336-342-8273   °[facility]  336-703-3100   °[facility]  336-570-6415   ° °Behavioral Health Resources in the Community: °Intensive Outpatient Programs °Organization         Address  Phone  Notes  °[facility] [address], [place], [place] 336-878-6098   °[facility] [address], [place], [place] 336-832-9800   °[facility] [address], [place], [place] ° 336-882-2125   °[facility] [address],  °[place], [place] 1-800-853-5163 or 336-641-4981   °Substance Abuse Resources °Organization         Address  Phone  Notes  °[facility]  336-882-2125   °[facility]  336-784-9470   °[facility]  336-285-9073   °[facility]  336-845-3988   °Residential & Outpatient Substance Abuse Program  1-800-659-3381   °Psychological Services °Organization         Address  Phone  Notes  ° Health  336- 832-9600   °[facility]  336- 378-7881    °[facility] [address], [place] 1-800-853-5163 or 336-641-4981   ° °Mobile Crisis Teams °Organization           Address  Phone  Notes  °[facility], Mobile Crisis Care Unit  1-877-626-1772   °Assertive °[facility] ° [address] [place], [place] 336-834-9664   °Sharon DeEsch [address] °[place] 336-554-5454   ° °Self-Help/Support Groups °Organization         Address  Phone             Notes  °[facility] - variety of support groups  336- 373-1402 Call for more information  °Narcotics Anonymous (NA), [facility] [address], °[place]  2 meetings at this location  ° °Residential Treatment Programs °Organization         Address  Phone  Notes  °[facility] [address],    °[place]  1-866-801-8205   °[facility] ° [address], [place], [place] 704-293-8524   °[facility] [address], [place] 336-845-3988 Admissions: 8am-3pm M-F  °[facility] [address]    °[place], [place] 336-841-1104   °[facility] [address], [place], [place] 336-379-7146   °[facility] [address]  °[place], [place] 336-285-9073   °[facility] - Intensive Outpatient [address], [place], [place] 336-852-3033   °[facility] [address]  °[place], [place] 1-877-615-2722 or 336-784-9470   °[facility] [address] [place], [place] 336-227-7417 Accepts Medicaid  °[facility] [address]  °[place] 1-800-659-3381 Substance Abuse/Addiction Treatment  ° °[place] Behavioral Health Resources °Organization         Address  Phone  Notes  °[facility]  (888) 581-9988   °Julie Brannon, PhD [address] [place], [place]   (336) 349-5553 or (336) 951-0000   °[facility]   [address] °[place], [place] (336) 349-4454   °[facility] [address], [place], [place] (336) 342-8316 Insurance/Medicaid/sponsorship through [facility]  °[facility] [address]                                    [place],  (336) 342-8316 Therapy/tele-psych/case  °[facility] [address]  ° [place],  (336) 349-2233    °Dr. Arfeen  (336) 349-4544   °[facility] [place]  [employer] [place] [facility] 1) [address], [place] °2) [address], [place] °3)  [address], [place] (336) 349-3220 °(336) 342-7768 ° °(336) 342-8140   °[place] [facility] (336) 342-1394 or (336) 342-3537 (After Hours)    ° ° °

## 2014-07-22 NOTE — ED Notes (Signed)
Pt reports abdominal cramps earlier today. Pt vomited at 1700. At present pt denies pain. Denies dysuria.
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
## 2014-08-06 LAB — GC/CHLAMYDIA PROBE AMP
CT Probe RNA: NEGATIVE
GC Probe RNA: NEGATIVE

## 2016-08-21 ENCOUNTER — Encounter (HOSPITAL_COMMUNITY): Payer: Self-pay | Admitting: Nurse Practitioner

## 2016-08-21 DIAGNOSIS — Z01411 Encounter for gynecological examination (general) (routine) with abnormal findings: Secondary | ICD-10-CM | POA: Insufficient documentation

## 2016-08-21 DIAGNOSIS — R102 Pelvic and perineal pain: Secondary | ICD-10-CM | POA: Insufficient documentation

## 2016-08-21 DIAGNOSIS — F17210 Nicotine dependence, cigarettes, uncomplicated: Secondary | ICD-10-CM | POA: Insufficient documentation

## 2016-08-21 NOTE — ED Triage Notes (Signed)
Pt states 2 days ago she "put in a baby wipe in her vagina when a her period started and she did not have a tampon or femine pad." Denies vagina odor, pain or discharge.

## 2016-08-22 ENCOUNTER — Emergency Department (HOSPITAL_COMMUNITY)
Admission: EM | Admit: 2016-08-22 | Discharge: 2016-08-22 | Disposition: A | Discharge status: Home / Self Care | Payer: Self-pay | Attending: Emergency Medicine | Admitting: Emergency Medicine

## 2016-08-22 DIAGNOSIS — Z01419 Encounter for gynecological examination (general) (routine) without abnormal findings: Secondary | ICD-10-CM
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
## 2016-08-22 NOTE — Discharge Instructions (Signed)
I recommend following up with the women's clinic listed below if he continued having an abnormal sensation chair vagina/pelvic region. Return to the emergency department if symptoms worsen or new onset of fever, abdominal pain, vomiting, vaginal discharge, vaginal bleeding, pain or burning with urination.

## 2017-08-05 ENCOUNTER — Other Ambulatory Visit: Payer: Self-pay

## 2017-08-05 ENCOUNTER — Ambulatory Visit (HOSPITAL_COMMUNITY)
Admission: EM | Admit: 2017-08-05 | Discharge: 2017-08-05 | Disposition: A | Discharge status: Home / Self Care | Payer: Self-pay | Attending: Family Medicine | Admitting: Family Medicine

## 2017-08-05 ENCOUNTER — Encounter (HOSPITAL_COMMUNITY): Payer: Self-pay | Admitting: Emergency Medicine

## 2017-08-05 DIAGNOSIS — Z3202 Encounter for pregnancy test, result negative: Secondary | ICD-10-CM

## 2017-08-05 DIAGNOSIS — Z8614 Personal history of Methicillin resistant Staphylococcus aureus infection: Secondary | ICD-10-CM | POA: Insufficient documentation

## 2017-08-05 DIAGNOSIS — M8610 Other acute osteomyelitis, unspecified site: Secondary | ICD-10-CM | POA: Insufficient documentation

## 2017-08-05 DIAGNOSIS — N898 Other specified noninflammatory disorders of vagina: Secondary | ICD-10-CM | POA: Insufficient documentation

## 2017-08-05 DIAGNOSIS — R300 Dysuria: Secondary | ICD-10-CM | POA: Insufficient documentation

## 2017-08-05 DIAGNOSIS — F17210 Nicotine dependence, cigarettes, uncomplicated: Secondary | ICD-10-CM | POA: Insufficient documentation

## 2017-08-05 LAB — POCT URINALYSIS DIP (DEVICE)
Bilirubin Urine: NEGATIVE
GLUCOSE, UA: NEGATIVE mg/dL
KETONES UR: NEGATIVE mg/dL
NITRITE: NEGATIVE
PROTEIN: NEGATIVE mg/dL
Specific Gravity, Urine: 1.02 (ref 1.005–1.030)
UROBILINOGEN UA: 0.2 mg/dL (ref 0.0–1.0)
pH: 7 (ref 5.0–8.0)

## 2017-08-05 LAB — POCT PREGNANCY, URINE: PREG TEST UR: NEGATIVE

## 2017-08-05 MED ORDER — FLUCONAZOLE 150 MG PO TABS: 150.0000 mg | ORAL_TABLET | Freq: Once | ORAL | 0 refills | Status: AC

## 2017-08-05 MED ORDER — LIDOCAINE HCL (PF) 1 % IJ SOLN
INTRAMUSCULAR | Status: AC
  Filled 2017-08-05: qty 2

## 2017-08-05 MED ORDER — METRONIDAZOLE 500 MG PO TABS: 500.0000 mg | ORAL_TABLET | Freq: Two times a day (BID) | ORAL | 0 refills | Status: AC

## 2017-08-05 MED ORDER — AZITHROMYCIN 250 MG PO TABS
ORAL_TABLET | ORAL | Status: AC
  Filled 2017-08-05: qty 4

## 2017-08-05 MED ORDER — CEFTRIAXONE SODIUM 250 MG IJ SOLR
INTRAMUSCULAR | Status: AC
  Filled 2017-08-05: qty 250

## 2017-08-05 MED ORDER — AZITHROMYCIN 250 MG PO TABS
1000.0000 mg | ORAL_TABLET | Freq: Once | ORAL | Status: AC
Start: 2017-08-05 — End: 2017-08-05
  Administered 2017-08-05: 1000 mg via ORAL

## 2017-08-05 MED ORDER — CEFTRIAXONE SODIUM 250 MG IJ SOLR
250.0000 mg | Freq: Once | INTRAMUSCULAR | Status: AC
  Administered 2017-08-05: 250 mg via INTRAMUSCULAR

## 2017-08-05 MED ORDER — METRONIDAZOLE 500 MG PO TABS: 500.0000 mg | ORAL_TABLET | Freq: Two times a day (BID) | ORAL | 0 refills | Status: DC

## 2017-08-05 NOTE — ED Triage Notes (Signed)
Pt here for STD check, pt c/o vaginal discharge, irritation, itchiness. x1 week.

## 2017-08-05 NOTE — Discharge Instructions (Addendum)
We have treated you today for gonorrhea and chlamydia, with ceftriaxone and azithromycin. I have also sent in diflucan and metronidazole for yeast and BV. Please refrain from sexual intercourse for 7 days while medicines eliminating infection.   We are testing you for Gonorrhea, Chlamydia, Trichomonas, Yeast and Bacterial Vaginosis. We will call you if anything is positive and let you know if you require any further treatment. Please inform partners of any positive results. We are also sending urine off for culture to check for infection.   Please return if symptoms not improving with treatment, development of fever, nausea, vomiting, abdominal pain.
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
## 2017-08-05 NOTE — ED Triage Notes (Signed)
Pt states sometimes she has to pee and nothing comes out.

## 2017-08-07 LAB — URINE CULTURE

## 2017-08-08 LAB — GC/CHLAMYDIA PROBE AMP (~~LOC~~) NOT AT ARMC
Chlamydia: NEGATIVE
Neisseria Gonorrhea: NEGATIVE

## 2018-03-03 ENCOUNTER — Encounter (HOSPITAL_COMMUNITY): Payer: Self-pay | Admitting: Emergency Medicine

## 2018-03-03 ENCOUNTER — Ambulatory Visit (HOSPITAL_COMMUNITY)
Admission: EM | Admit: 2018-03-03 | Discharge: 2018-03-03 | Disposition: A | Discharge status: Home / Self Care | Payer: Self-pay | Attending: Internal Medicine | Admitting: Internal Medicine

## 2018-03-03 DIAGNOSIS — Z113 Encounter for screening for infections with a predominantly sexual mode of transmission: Secondary | ICD-10-CM

## 2018-03-03 DIAGNOSIS — F17210 Nicotine dependence, cigarettes, uncomplicated: Secondary | ICD-10-CM | POA: Insufficient documentation

## 2018-03-03 DIAGNOSIS — Z3202 Encounter for pregnancy test, result negative: Secondary | ICD-10-CM

## 2018-03-03 DIAGNOSIS — Z202 Contact with and (suspected) exposure to infections with a predominantly sexual mode of transmission: Secondary | ICD-10-CM | POA: Insufficient documentation

## 2018-03-03 DIAGNOSIS — N898 Other specified noninflammatory disorders of vagina: Secondary | ICD-10-CM | POA: Insufficient documentation

## 2018-03-03 DIAGNOSIS — Z8614 Personal history of Methicillin resistant Staphylococcus aureus infection: Secondary | ICD-10-CM | POA: Insufficient documentation

## 2018-03-03 LAB — POCT PREGNANCY, URINE: PREG TEST UR: NEGATIVE

## 2018-03-03 MED ORDER — METRONIDAZOLE 500 MG PO TABS
ORAL_TABLET | ORAL | Status: AC
Start: 2018-03-03 — End: ?
  Filled 2018-03-03: qty 4

## 2018-03-03 MED ORDER — METRONIDAZOLE 500 MG PO TABS: 2000.0000 mg | ORAL_TABLET | Freq: Once | ORAL | Status: DC

## 2018-03-03 MED ORDER — METRONIDAZOLE 500 MG PO TABS: 2000.0000 mg | ORAL_TABLET | Freq: Once | ORAL | 0 refills | Status: AC

## 2018-03-03 NOTE — ED Triage Notes (Signed)
Pt states her partner tested positive for trich and BV, requesting medication for it. C/o vaginal discharge.
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
## 2018-03-03 NOTE — Discharge Instructions (Signed)
Will notify you of any positive findings from your vaginal test and if any changes to treatment are needed.   Please withhold from intercourse for the next week. Please use condoms to prevent STD's.   If any positive findings please notify your partners as they will also need treatment.

## 2018-03-06 LAB — CERVICOVAGINAL ANCILLARY ONLY
BACTERIAL VAGINITIS: POSITIVE — AB
Candida vaginitis: POSITIVE — AB
Chlamydia: NEGATIVE
NEISSERIA GONORRHEA: NEGATIVE
Trichomonas: NEGATIVE

## 2018-03-07 ENCOUNTER — Telehealth (HOSPITAL_COMMUNITY): Payer: Self-pay

## 2018-03-07 MED ORDER — FLUCONAZOLE 150 MG PO TABS: 150.0000 mg | ORAL_TABLET | Freq: Every day | ORAL | 0 refills | Status: AC

## 2018-03-07 NOTE — Telephone Encounter (Signed)
Bacterial Vaginosis test is positive.  Prescription for metronidazole was given at the urgent care visit. Pt contacted regarding results. Answered all questions. Verbalized understanding.  Yeast is positive.  Prescription for fluconazole 150mg  po now, repeat dose in 3d if needed, #2 no refills, sent to the pharmacy of record.  Recheck or followup with PCP for further evaluation if symptoms are not improving.    No answer, voicemail left.

## 2019-04-07 ENCOUNTER — Encounter (HOSPITAL_COMMUNITY): Payer: Self-pay | Admitting: *Deleted

## 2019-04-07 ENCOUNTER — Other Ambulatory Visit: Payer: Self-pay

## 2019-04-07 ENCOUNTER — Emergency Department (HOSPITAL_COMMUNITY)
Admission: EM | Admit: 2019-04-07 | Discharge: 2019-04-08 | Disposition: A | Discharge status: Home / Self Care | Payer: Self-pay | Attending: Emergency Medicine | Admitting: Emergency Medicine

## 2019-04-07 DIAGNOSIS — F1210 Cannabis abuse, uncomplicated: Secondary | ICD-10-CM | POA: Insufficient documentation

## 2019-04-07 DIAGNOSIS — F17210 Nicotine dependence, cigarettes, uncomplicated: Secondary | ICD-10-CM | POA: Insufficient documentation

## 2019-04-07 DIAGNOSIS — F1910 Other psychoactive substance abuse, uncomplicated: Secondary | ICD-10-CM | POA: Insufficient documentation

## 2019-04-07 DIAGNOSIS — L089 Local infection of the skin and subcutaneous tissue, unspecified: Secondary | ICD-10-CM | POA: Insufficient documentation
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
## 2019-04-08 ENCOUNTER — Emergency Department (HOSPITAL_COMMUNITY): Payer: Self-pay

## 2019-04-08 LAB — CBC WITH DIFFERENTIAL/PLATELET
Abs Immature Granulocytes: 0.04 10*3/uL (ref 0.00–0.07)
Basophils Absolute: 0.1 10*3/uL (ref 0.0–0.1)
Basophils Relative: 1 %
Eosinophils Absolute: 0.1 10*3/uL (ref 0.0–0.5)
Eosinophils Relative: 1 %
HCT: 41 % (ref 36.0–46.0)
Hemoglobin: 13.1 g/dL (ref 12.0–15.0)
Immature Granulocytes: 0 %
Lymphocytes Relative: 33 %
Lymphs Abs: 3.3 10*3/uL (ref 0.7–4.0)
MCH: 26.7 pg (ref 26.0–34.0)
MCHC: 32 g/dL (ref 30.0–36.0)
MCV: 83.7 fL (ref 80.0–100.0)
Monocytes Absolute: 0.7 10*3/uL (ref 0.1–1.0)
Monocytes Relative: 7 %
Neutro Abs: 5.8 10*3/uL (ref 1.7–7.7)
Neutrophils Relative %: 58 %
Platelets: 351 10*3/uL (ref 150–400)
RBC: 4.9 MIL/uL (ref 3.87–5.11)
RDW: 14.9 % (ref 11.5–15.5)
WBC: 10 10*3/uL (ref 4.0–10.5)
nRBC: 0 % (ref 0.0–0.2)

## 2019-04-08 LAB — RAPID URINE DRUG SCREEN, HOSP PERFORMED
Amphetamines: NOT DETECTED
Barbiturates: NOT DETECTED
Benzodiazepines: NOT DETECTED
Cocaine: POSITIVE — AB
Opiates: NOT DETECTED
Tetrahydrocannabinol: POSITIVE — AB

## 2019-04-08 LAB — COMPREHENSIVE METABOLIC PANEL
ALT: 15 U/L (ref 0–44)
AST: 19 U/L (ref 15–41)
Albumin: 4 g/dL (ref 3.5–5.0)
Alkaline Phosphatase: 73 U/L (ref 38–126)
Anion gap: 10 (ref 5–15)
BUN: 13 mg/dL (ref 6–20)
CO2: 21 mmol/L — ABNORMAL LOW (ref 22–32)
Calcium: 8.7 mg/dL — ABNORMAL LOW (ref 8.9–10.3)
Chloride: 107 mmol/L (ref 98–111)
Creatinine, Ser: 0.92 mg/dL (ref 0.44–1.00)
GFR calc Af Amer: >60 mL/min (ref >60)
GFR calc non Af Amer: >60 mL/min (ref >60)
Glucose, Bld: 133 mg/dL — ABNORMAL HIGH (ref 70–99)
Potassium: 3.3 mmol/L — ABNORMAL LOW (ref 3.5–5.1)
Sodium: 138 mmol/L (ref 135–145)
Total Bilirubin: 0.2 mg/dL — ABNORMAL LOW (ref 0.3–1.2)
Total Protein: 8.6 g/dL — ABNORMAL HIGH (ref 6.5–8.1)

## 2019-04-08 LAB — ETHANOL: Alcohol, Ethyl (B): 61 mg/dL — ABNORMAL HIGH (ref <10)

## 2019-04-08 MED ORDER — LIDOCAINE 4 % EX GEL: 1.0000 "application " | Freq: Three times a day (TID) | CUTANEOUS | 1 refills | Status: DC | PRN

## 2019-04-08 MED ORDER — BACITRACIN ZINC 500 UNIT/GM EX OINT
TOPICAL_OINTMENT | Freq: Two times a day (BID) | CUTANEOUS | Status: DC
  Administered 2019-04-08: 1 via TOPICAL
  Filled 2019-04-08: qty 0.9

## 2019-04-08 MED ORDER — SULFAMETHOXAZOLE-TRIMETHOPRIM 800-160 MG PO TABS: 2.0000 | ORAL_TABLET | Freq: Two times a day (BID) | ORAL | 0 refills | Status: AC

## 2019-04-08 NOTE — Discharge Instructions (Signed)
Please take the antibiotics prescribed for the arm.  Make sure the wound is cleansed twice a day and appropriate dressing applied.  I would like you to follow-up with the infectious disease doctors to see what is going on as far as recovery of your wound infection.  If they feel that you need to see additional specialist then they can refer you to those doctors.  Finally, [facility] is another source of follow-up for you.  This clinic will see you for your medical concerns even if you do not have insurance.
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
## 2019-04-24 ENCOUNTER — Other Ambulatory Visit: Payer: Self-pay

## 2019-04-24 DIAGNOSIS — F17210 Nicotine dependence, cigarettes, uncomplicated: Secondary | ICD-10-CM | POA: Insufficient documentation

## 2019-04-24 DIAGNOSIS — N898 Other specified noninflammatory disorders of vagina: Secondary | ICD-10-CM | POA: Insufficient documentation

## 2019-04-24 DIAGNOSIS — Z202 Contact with and (suspected) exposure to infections with a predominantly sexual mode of transmission: Secondary | ICD-10-CM | POA: Insufficient documentation

## 2019-04-25 ENCOUNTER — Encounter (HOSPITAL_COMMUNITY): Payer: Self-pay | Admitting: Emergency Medicine

## 2019-04-25 ENCOUNTER — Emergency Department (HOSPITAL_COMMUNITY)
Admission: EM | Admit: 2019-04-25 | Discharge: 2019-04-25 | Disposition: A | Discharge status: Home / Self Care | Payer: Self-pay | Attending: Emergency Medicine | Admitting: Emergency Medicine

## 2019-04-25 DIAGNOSIS — Z202 Contact with and (suspected) exposure to infections with a predominantly sexual mode of transmission: Secondary | ICD-10-CM

## 2019-04-25 MED ORDER — CEFTRIAXONE SODIUM 250 MG IJ SOLR
250.0000 mg | Freq: Once | INTRAMUSCULAR | Status: AC
  Administered 2019-04-25: 02:00:00 250 mg via INTRAMUSCULAR
  Filled 2019-04-25: qty 250

## 2019-04-25 MED ORDER — STERILE WATER FOR INJECTION IJ SOLN
INTRAMUSCULAR | Status: AC
  Administered 2019-04-25: 02:00:00 1 mL
  Filled 2019-04-25: qty 10

## 2019-04-25 MED ORDER — AZITHROMYCIN 250 MG PO TABS
1000.0000 mg | ORAL_TABLET | Freq: Once | ORAL | Status: AC
  Administered 2019-04-25: 02:00:00 1000 mg via ORAL
  Filled 2019-04-25: qty 4

## 2019-04-25 NOTE — ED Notes (Signed)
Pelvic cart at bedside. 

## 2019-04-25 NOTE — ED Notes (Signed)
Pt was verbalized discharge instructions. Pt had no further questions at this time. NAD. 

## 2019-04-25 NOTE — ED Triage Notes (Signed)
Patient here from home with complaints of STD reports that she thinks she has chlamydia and needs treatment. Vaginal discharge.

## 2019-04-25 NOTE — Discharge Instructions (Addendum)
Thank you for allowing me to care for you today in the Emergency Department.   You were treated for gonorrhea and chlamydia today.  It is important that all of your sexual partners are treated or you can get reinfected.  You should also avoid all sexual activities for the next 7 days per CDC guidelines.  You can go to the [facility] for regular STD screening.  Return to the emergency department if you develop high fevers, shivering, severe abdominal pain, worsening vaginal discharge or pelvic pain, or other new or worsening symptoms.
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
## 2020-03-07 ENCOUNTER — Other Ambulatory Visit: Payer: Self-pay

## 2020-03-07 ENCOUNTER — Encounter (HOSPITAL_COMMUNITY): Payer: Self-pay

## 2020-03-07 ENCOUNTER — Ambulatory Visit (HOSPITAL_COMMUNITY)
Admission: EM | Admit: 2020-03-07 | Discharge: 2020-03-07 | Disposition: A | Discharge status: Home / Self Care | Payer: Self-pay | Attending: Family Medicine | Admitting: Family Medicine

## 2020-03-07 DIAGNOSIS — S41101D Unspecified open wound of right upper arm, subsequent encounter: Secondary | ICD-10-CM | POA: Insufficient documentation

## 2020-03-07 DIAGNOSIS — Z202 Contact with and (suspected) exposure to infections with a predominantly sexual mode of transmission: Secondary | ICD-10-CM | POA: Insufficient documentation

## 2020-03-07 DIAGNOSIS — N898 Other specified noninflammatory disorders of vagina: Secondary | ICD-10-CM | POA: Insufficient documentation

## 2020-03-07 DIAGNOSIS — Z8614 Personal history of Methicillin resistant Staphylococcus aureus infection: Secondary | ICD-10-CM | POA: Insufficient documentation

## 2020-03-07 DIAGNOSIS — Z7251 High risk heterosexual behavior: Secondary | ICD-10-CM | POA: Insufficient documentation

## 2020-03-07 MED ORDER — CLINDAMYCIN HCL 300 MG PO CAPS: 300.0000 mg | ORAL_CAPSULE | Freq: Three times a day (TID) | ORAL | 0 refills | Status: DC

## 2020-03-07 MED ORDER — TRAMADOL-ACETAMINOPHEN 37.5-325 MG PO TABS: 1.0000 | ORAL_TABLET | Freq: Four times a day (QID) | ORAL | 0 refills | Status: DC | PRN

## 2020-03-07 NOTE — ED Triage Notes (Signed)
Pt is here with vaginal odor, discharge that started a week ago, pt last had unprotected sex a week ago. Pt states she had MRSA 10 years ago, the infection site(right upper arm) still hurts her she states.

## 2020-03-07 NOTE — Discharge Instructions (Signed)
Clean the wound daily and apply a new bandage Take antibiotic 3 times a day with food Take a probiotic while you are on a strong antibiotic Take ibuprofen 800 mg 3 times a day with food.  This would be for moderate pain Take Ultracet if pain is severe I have placed a referral to the wound clinic.  They should call you 1 day next week
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
## 2020-03-10 ENCOUNTER — Telehealth (HOSPITAL_COMMUNITY): Payer: Self-pay

## 2020-03-10 LAB — CERVICOVAGINAL ANCILLARY ONLY
Bacterial Vaginitis (gardnerella): POSITIVE — AB
Chlamydia: NEGATIVE
Comment: NEGATIVE
Comment: NEGATIVE
Comment: NEGATIVE
Comment: NORMAL
Neisseria Gonorrhea: NEGATIVE
Trichomonas: POSITIVE — AB

## 2020-03-10 MED ORDER — METRONIDAZOLE 500 MG PO TABS: 500.0000 mg | ORAL_TABLET | Freq: Two times a day (BID) | ORAL | 0 refills | Status: DC

## 2020-03-11 LAB — AEROBIC CULTURE W GRAM STAIN (SUPERFICIAL SPECIMEN)
Gram Stain: NONE SEEN
Special Requests: NORMAL

## 2020-03-20 ENCOUNTER — Encounter (HOSPITAL_COMMUNITY): Payer: Self-pay

## 2020-03-20 ENCOUNTER — Other Ambulatory Visit: Payer: Self-pay

## 2020-03-20 ENCOUNTER — Ambulatory Visit (HOSPITAL_COMMUNITY)
Admission: EM | Admit: 2020-03-20 | Discharge: 2020-03-20 | Disposition: A | Discharge status: Home / Self Care | Payer: Self-pay | Attending: Physician Assistant | Admitting: Physician Assistant

## 2020-03-20 DIAGNOSIS — Z8614 Personal history of Methicillin resistant Staphylococcus aureus infection: Secondary | ICD-10-CM

## 2020-03-20 DIAGNOSIS — Z3202 Encounter for pregnancy test, result negative: Secondary | ICD-10-CM

## 2020-03-20 DIAGNOSIS — S41101D Unspecified open wound of right upper arm, subsequent encounter: Secondary | ICD-10-CM

## 2020-03-20 LAB — POC URINE PREG, ED: Preg Test, Ur: NEGATIVE

## 2020-03-20 MED ORDER — HYDROCODONE-ACETAMINOPHEN 5-325 MG PO TABS: 1.0000 | ORAL_TABLET | Freq: Every day | ORAL | 0 refills | Status: AC

## 2020-03-20 MED ORDER — DOXYCYCLINE HYCLATE 100 MG PO CAPS: 100.0000 mg | ORAL_CAPSULE | Freq: Two times a day (BID) | ORAL | 0 refills | Status: DC

## 2020-03-20 MED ORDER — AMOXICILLIN-POT CLAVULANATE 875-125 MG PO TABS: 1.0000 | ORAL_TABLET | Freq: Two times a day (BID) | ORAL | 0 refills | Status: DC

## 2020-03-20 MED ORDER — NAPROXEN 375 MG PO TABS: 375.0000 mg | ORAL_TABLET | Freq: Two times a day (BID) | ORAL | 0 refills | Status: DC
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
## 2020-03-20 NOTE — Discharge Instructions (Addendum)
Take the medications as prescribed - augmentin and doxycycline 2 times a day for 10 days - take vicodin only at night and for severe pain - take naproxen 2 times a day with food  Call both wound care centers and take the soonest available appointment  Call family medicine center to establish care  If you develop fever, severely worsening pain, whole arm swelling, go to the Emergency department

## 2020-03-21 ENCOUNTER — Emergency Department (HOSPITAL_COMMUNITY)
Admission: EM | Admit: 2020-03-21 | Discharge: 2020-03-21 | Disposition: A | Discharge status: Home / Self Care | Payer: Self-pay | Attending: Emergency Medicine | Admitting: Emergency Medicine

## 2020-03-21 ENCOUNTER — Emergency Department (HOSPITAL_COMMUNITY): Payer: Self-pay

## 2020-03-21 ENCOUNTER — Other Ambulatory Visit: Payer: Self-pay

## 2020-03-21 DIAGNOSIS — W3400XA Accidental discharge from unspecified firearms or gun, initial encounter: Secondary | ICD-10-CM | POA: Insufficient documentation

## 2020-03-21 DIAGNOSIS — Y9389 Activity, other specified: Secondary | ICD-10-CM | POA: Insufficient documentation

## 2020-03-21 DIAGNOSIS — F17210 Nicotine dependence, cigarettes, uncomplicated: Secondary | ICD-10-CM | POA: Insufficient documentation

## 2020-03-21 DIAGNOSIS — Y999 Unspecified external cause status: Secondary | ICD-10-CM | POA: Insufficient documentation

## 2020-03-21 DIAGNOSIS — Z79899 Other long term (current) drug therapy: Secondary | ICD-10-CM | POA: Insufficient documentation

## 2020-03-21 DIAGNOSIS — Y9289 Other specified places as the place of occurrence of the external cause: Secondary | ICD-10-CM | POA: Insufficient documentation

## 2020-03-21 DIAGNOSIS — S41101A Unspecified open wound of right upper arm, initial encounter: Secondary | ICD-10-CM

## 2020-03-21 MED ORDER — DOXYCYCLINE HYCLATE 100 MG PO TABS
100.0000 mg | ORAL_TABLET | Freq: Once | ORAL | Status: AC
  Administered 2020-03-21: 100 mg via ORAL
  Filled 2020-03-21: qty 1

## 2020-03-21 MED ORDER — OXYCODONE-ACETAMINOPHEN 5-325 MG PO TABS
1.0000 | ORAL_TABLET | Freq: Once | ORAL | Status: AC
  Administered 2020-03-21: 1 via ORAL
  Filled 2020-03-21: qty 1

## 2020-03-21 MED ORDER — AMOXICILLIN-POT CLAVULANATE 875-125 MG PO TABS
1.0000 | ORAL_TABLET | Freq: Once | ORAL | Status: AC
  Administered 2020-03-21: 1 via ORAL
  Filled 2020-03-21: qty 1
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
## 2020-03-21 NOTE — ED Notes (Signed)
PT d/c instructions reviewed with pt. PT verbalized understanding. Pt reprors d/c to home with friend.
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
## 2020-03-21 NOTE — Discharge Instructions (Addendum)
At this time there does not appear to be the presence of an emergent medical condition, however there is always the potential for conditions to change. Please read and follow the below instructions.  Please return to the Emergency Department immediately for any new or worsening symptoms. Please be sure to follow up with your Primary Care Provider within one week regarding your visit today; please call their office to schedule an appointment even if you are feeling better for a follow-up visit. Please pick up your prescriptions at the pharmacy after you leave the ER today.  Please take your antibiotics Augmentin and doxycycline as prescribed by the urgent care.  Drink plenty of water and get plenty of rest.  Follow-up with your primary care doctor and the wound care center for review evaluation of your arm.  Return immediately to the ER if you have any new or worsening symptoms. You were given a 1 pain pill in the ER today.  This pill will make you drowsy so do not drive, drink alcohol or perform any dangerous activities for the rest of the day.  Get help right away if: You have a red streak going away from your wound. The edges of the wound open up and separate. Your wound is bleeding, and the bleeding does not stop with gentle pressure. You have a rash. You faint. You have trouble breathing. You have any new/concerning or worsening of symptoms  Please read the additional information packets attached to your discharge summary.  Do not take your medicine if  develop an itchy rash, swelling in your mouth or lips, or difficulty breathing; call 911 and seek immediate emergency medical attention if this occurs.  You may review your lab tests and imaging results in their entirety on your MyChart account.  Please discuss all results of fully with your primary care provider and other specialist at your follow-up visit.  Note: Portions of this text may have been transcribed using voice recognition  software. Every effort was made to ensure accuracy; however, inadvertent computerized transcription errors may still be present.

## 2020-03-23 LAB — AEROBIC CULTURE W GRAM STAIN (SUPERFICIAL SPECIMEN)
Gram Stain: NONE SEEN
Special Requests: NORMAL

## 2020-03-24 ENCOUNTER — Telehealth: Payer: Self-pay | Admitting: *Deleted

## 2020-03-24 NOTE — Telephone Encounter (Signed)
Post ED Visit - Positive Culture Follow-up  Culture report reviewed by antimicrobial stewardship pharmacist: [facility] []  , Pharm.D. []  Enzo Bi, Pharm.D., BCPS AQ-ID []  , Pharm.D., BCPS []  Celedonio Miyamoto, Pharm.D., BCPS []  [place], Garvin Fila.D., BCPS, AAHIVP []  , Pharm.D., BCPS, AAHIVP []  Georgina Pillion, PharmD, BCPS []  , PharmD, BCPS []  [place], PharmD, BCPS []  [address], PharmD []  , PharmD, BCPS []  Estella Husk, PharmD  Pharmacy Team []  Lysle Pearl, PharmD []  , PharmD []  Phillips Climes, PharmD []  , Rph []  Agapito Games) , PharmD []  Verlan Friends, PharmD []  , PharmD []  Mervyn Gay, PharmD []  , PharmD []  Vinnie Level, PharmD []  [facility], PharmD []  , PharmD []  Len Childs, PharmD   Positive urine culture Treated with Augmentin and Doxycycline, organism sensitive to the same and no further patient follow-up is required at this time.  [facility] 03/24/2020, 8:42 AM

## 2020-03-27 ENCOUNTER — Ambulatory Visit (HOSPITAL_COMMUNITY)
Admission: EM | Admit: 2020-03-27 | Discharge: 2020-03-27 | Disposition: A | Discharge status: Home / Self Care | Payer: Self-pay | Attending: Emergency Medicine | Admitting: Emergency Medicine

## 2020-03-27 ENCOUNTER — Other Ambulatory Visit: Payer: Self-pay

## 2020-03-27 ENCOUNTER — Encounter (HOSPITAL_COMMUNITY): Payer: Self-pay | Admitting: Emergency Medicine

## 2020-03-27 DIAGNOSIS — Z3202 Encounter for pregnancy test, result negative: Secondary | ICD-10-CM

## 2020-03-27 DIAGNOSIS — N760 Acute vaginitis: Secondary | ICD-10-CM

## 2020-03-27 DIAGNOSIS — Z202 Contact with and (suspected) exposure to infections with a predominantly sexual mode of transmission: Secondary | ICD-10-CM

## 2020-03-27 LAB — POCT URINALYSIS DIPSTICK, ED / UC
Bilirubin Urine: NEGATIVE
Glucose, UA: NEGATIVE mg/dL
Ketones, ur: NEGATIVE mg/dL
Nitrite: NEGATIVE
Protein, ur: NEGATIVE mg/dL
Specific Gravity, Urine: 1.015 (ref 1.005–1.030)
Urobilinogen, UA: 0.2 mg/dL (ref 0.0–1.0)
pH: 6 (ref 5.0–8.0)

## 2020-03-27 LAB — POC URINE PREG, ED: Preg Test, Ur: NEGATIVE

## 2020-03-27 MED ORDER — FLUCONAZOLE 150 MG PO TABS: 150.0000 mg | ORAL_TABLET | Freq: Once | ORAL | 0 refills | Status: AC
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
## 2020-03-27 NOTE — Discharge Instructions (Signed)
Please pick up Diflucan from pharmacy, 1 tablet today, may repeat in 72 hours of solving symptoms, states third tablet for after completing antibiotics We will repeat swab to confirm because of discharge/discomfort Follow-up if not improving or worsening

## 2020-03-28 LAB — CERVICOVAGINAL ANCILLARY ONLY
Bacterial Vaginitis (gardnerella): NEGATIVE
Candida Glabrata: NEGATIVE
Candida Vaginitis: POSITIVE — AB
Chlamydia: NEGATIVE
Comment: NEGATIVE
Comment: NEGATIVE
Comment: NEGATIVE
Comment: NEGATIVE
Comment: NEGATIVE
Comment: NORMAL
Neisseria Gonorrhea: NEGATIVE
Trichomonas: NEGATIVE
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
## 2020-04-09 ENCOUNTER — Other Ambulatory Visit: Payer: Self-pay

## 2020-04-09 ENCOUNTER — Ambulatory Visit (HOSPITAL_COMMUNITY)
Admission: EM | Admit: 2020-04-09 | Discharge: 2020-04-09 | Disposition: A | Discharge status: Home / Self Care | Payer: Self-pay | Attending: Family Medicine | Admitting: Family Medicine

## 2020-04-09 ENCOUNTER — Encounter (HOSPITAL_COMMUNITY): Payer: Self-pay | Admitting: Emergency Medicine

## 2020-04-09 DIAGNOSIS — Z20822 Contact with and (suspected) exposure to covid-19: Secondary | ICD-10-CM | POA: Insufficient documentation

## 2020-04-09 DIAGNOSIS — R1030 Lower abdominal pain, unspecified: Secondary | ICD-10-CM

## 2020-04-09 DIAGNOSIS — R519 Headache, unspecified: Secondary | ICD-10-CM | POA: Insufficient documentation

## 2020-04-09 DIAGNOSIS — F17210 Nicotine dependence, cigarettes, uncomplicated: Secondary | ICD-10-CM | POA: Insufficient documentation

## 2020-04-09 DIAGNOSIS — L03113 Cellulitis of right upper limb: Secondary | ICD-10-CM | POA: Insufficient documentation

## 2020-04-09 DIAGNOSIS — Z791 Long term (current) use of non-steroidal anti-inflammatories (NSAID): Secondary | ICD-10-CM | POA: Insufficient documentation

## 2020-04-09 LAB — CBC
HCT: 36 % (ref 36.0–46.0)
Hemoglobin: 12.1 g/dL (ref 12.0–15.0)
MCH: 27.4 pg (ref 26.0–34.0)
MCHC: 33.6 g/dL (ref 30.0–36.0)
MCV: 81.6 fL (ref 80.0–100.0)
Platelets: 246 10*3/uL (ref 150–400)
RBC: 4.41 MIL/uL (ref 3.87–5.11)
RDW: 13.6 % (ref 11.5–15.5)
WBC: 7.3 10*3/uL (ref 4.0–10.5)
nRBC: 0 % (ref 0.0–0.2)

## 2020-04-09 LAB — POCT URINALYSIS DIPSTICK, ED / UC
Bilirubin Urine: NEGATIVE
Glucose, UA: NEGATIVE mg/dL
Ketones, ur: NEGATIVE mg/dL
Leukocytes,Ua: NEGATIVE
Nitrite: NEGATIVE
Protein, ur: NEGATIVE mg/dL
Specific Gravity, Urine: 1.02 (ref 1.005–1.030)
Urobilinogen, UA: 0.2 mg/dL (ref 0.0–1.0)
pH: 5 (ref 5.0–8.0)

## 2020-04-09 LAB — POC URINE PREG, ED: Preg Test, Ur: NEGATIVE

## 2020-04-09 LAB — SEDIMENTATION RATE: Sed Rate: 35 mm/hr — ABNORMAL HIGH (ref 0–22)

## 2020-04-09 MED ORDER — OXYCODONE-ACETAMINOPHEN 5-325 MG PO TABS: 2.0000 | ORAL_TABLET | ORAL | 0 refills | Status: DC | PRN

## 2020-04-09 MED ORDER — ACETAMINOPHEN 325 MG PO TABS
ORAL_TABLET | ORAL | Status: AC
  Filled 2020-04-09: qty 2

## 2020-04-09 MED ORDER — ACETAMINOPHEN 325 MG PO TABS
650.0000 mg | ORAL_TABLET | Freq: Once | ORAL | Status: AC
  Administered 2020-04-09: 650 mg via ORAL

## 2020-04-09 NOTE — ED Triage Notes (Signed)
Pt presents with abdominal pain xs 2 days. Was seen on last month and was given antibiotic that caused yeast infection.   C/o of arm pain that recently was caused by wound. Is requesting pain medication.
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
## 2020-04-10 ENCOUNTER — Encounter (HOSPITAL_BASED_OUTPATIENT_CLINIC_OR_DEPARTMENT_OTHER): Payer: Self-pay | Attending: Internal Medicine | Admitting: Internal Medicine

## 2020-04-10 DIAGNOSIS — Z8249 Family history of ischemic heart disease and other diseases of the circulatory system: Secondary | ICD-10-CM | POA: Insufficient documentation

## 2020-04-10 DIAGNOSIS — L98499 Non-pressure chronic ulcer of skin of other sites with unspecified severity: Secondary | ICD-10-CM | POA: Insufficient documentation

## 2020-04-10 DIAGNOSIS — Z8349 Family history of other endocrine, nutritional and metabolic diseases: Secondary | ICD-10-CM | POA: Insufficient documentation

## 2020-04-10 LAB — CERVICOVAGINAL ANCILLARY ONLY
Bacterial Vaginitis (gardnerella): NEGATIVE
Candida Glabrata: NEGATIVE
Candida Vaginitis: POSITIVE — AB
Chlamydia: NEGATIVE
Comment: NEGATIVE
Comment: NEGATIVE
Comment: NEGATIVE
Comment: NEGATIVE
Comment: NEGATIVE
Comment: NORMAL
Neisseria Gonorrhea: NEGATIVE
Trichomonas: NEGATIVE

## 2020-04-10 LAB — SARS CORONAVIRUS 2 (TAT 6-24 HRS): SARS Coronavirus 2: NEGATIVE

## 2020-04-10 NOTE — Progress Notes (Signed)
Meyer, Abigail ([id number]) Visit Report for 04/10/2020 Abuse/Suicide Risk Screen Details Patient Name: Date of Service: Abigail Meyer 04/10/2020 10:30 A M Medical Record Number: [id number] Patient Account Number: [id number] Date of Birth/Sex: Treating RN: 1991-07-06 (29 y.o. [facility] Primary Care Caprisha Bridgett: PA Zenovia [place], [place] Other Clinician: Referring Dewanna Hurston: Treating Karon Heckendorn/Extender: Valentino Saxon in Treatment: 0 Abuse/Suicide Risk Screen Items Answer ABUSE RISK SCREEN: Has anyone close to you tried to hurt or harm you recentlyo No Do you feel uncomfortable with anyone in your familyo No Has anyone forced you do things that you didnt want to doo No Electronic Signature(s) Signed: 04/10/2020 5:17:55 PM By: Cherylin Mylar Entered By: Cherylin Mylar on 04/10/2020 11:04:55 -------------------------------------------------------------------------------- Activities of Daily Living Details Patient Name: Date of Service: Abigail Meyer 04/10/2020 10:30 A M Medical Record Number: [id number] Patient Account Number: [id number] Date of Birth/Sex: Treating RN: 1990/08/13 (29 y.o. Har[employer] [facility] Primary Care Latosha Gaylord: PA Zenovia Jordan, [place] Other Clinician: Referring Shamyra Farias: Treating Amanie Mcculley/Extender: Valentino Saxon in Treatment: 0 Activities of Daily Living Items Answer Activities of Daily Living (Please select one for each item) Drive Automobile Completely Able T Medications ake Completely Able Use T elephone Completely Able Care for Appearance Completely Able Use T oilet Completely Able Bath / Shower Completely Able Dress Self Completely Able Feed Self Completely Able Walk Completely Able Get In / Out Bed Completely Able Housework Completely Able Prepare Meals Completely Able Handle Money Completely Able Shop for Self Completely Able Electronic Signature(s) Signed: 04/10/2020 5:17:55 PM By: Cherylin Mylar Entered By: Cherylin Mylar on  04/10/2020 11:05:38 -------------------------------------------------------------------------------- Education Screening Details Patient Name: Date of Service: Abigail Meyer, Abigail Meyer. 04/10/2020 10:30 A M Medical Record Number: [id number] Patient Account Number: [id number] Date of Birth/Sex: Treating RN: 1990/08/20 (29 y.o. [employer] Primary Care Namish Krise: PA Zenovia Jordan, [place] Other Clinician: Referring Keian Odriscoll: Treating Aubryn Spinola/Extender: Valentino Saxon in Treatment: 0 Primary Learner Assessed: Patient Learning Preferences/Education Level/Primary Language Learning Preference: Explanation Preferred Language: English Cognitive Barrier Language Barrier: No Translator Needed: No Memory Deficit: No Emotional Barrier: No Cultural/Religious Beliefs Affecting Medical Care: No Physical Barrier Impaired Vision: No Impaired Hearing: No Decreased Hand dexterity: No Knowledge/Comprehension Knowledge Level: High Comprehension Level: High Ability to understand written instructions: High Ability to understand verbal instructions: High Motivation Anxiety Level: Calm Cooperation: Cooperative Education Importance: Acknowledges Need Interest in Health Problems: Asks Questions Perception: Coherent Willingness to Engage in Self-Management High Activities: Readiness to Engage in Self-Management High Activities: Electronic Signature(s) Signed: 04/10/2020 5:17:55 PM By: Cherylin Mylar Entered By: Cherylin Mylar on 04/10/2020 11:06:08 -------------------------------------------------------------------------------- Fall Risk Assessment Details Patient Name: Date of Service: Abigail Meyer, LO LITTA L. 04/10/2020 10:30 A M Medical Record Number: [id number] Patient Account Number: [id number] Date of Birth/Sex: Treating RN: 11-22-1990 (29 y.o. [employer] [facility] Primary Care Oria Klimas: PA TIENT, [place] Other Clinician: Referring Lameisha Schuenemann: Treating Nisaiah Bechtol/Extender: Valentino Saxon in  Treatment: 0 Fall Risk Assessment Items Have you had 2 or more falls in the last 12 monthso 0 No Have you had any fall that resulted in injury in the last 12 monthso 0 No FALLS RISK SCREEN History of falling - immediate or within 3 months 0 No Secondary diagnosis (Do you have 2 or more medical diagnoseso) 0 No Ambulatory aid None/bed rest/wheelchair/nurse 0 Yes Crutches/cane/walker 0 No Furniture 0 No Intravenous therapy Access/Saline/Heparin Lock 0 No Gait/Transferring Normal/ bed rest/ wheelchair 0 Yes Weak (short steps with or without shuffle, stooped but able to lift head while  walking, may seek 0 No support from furniture) Impaired (short steps with shuffle, may have difficulty arising from chair, head down, impaired 0 No balance) Mental Status Oriented to own ability 0 Yes Electronic Signature(s) Signed: 04/10/2020 5:17:55 PM By: Cherylin Mylar Entered By: Cherylin Mylar on 04/10/2020 11:06:27 -------------------------------------------------------------------------------- Foot Assessment Details Patient Name: Date of Service: Abigail Meyer, Abigail Meyer. 04/10/2020 10:30 A M Medical Record Number: [id number] Patient Account Number: [id number] Date of Birth/Sex: Treating RN: [birth year] (29 y.o. [employer] Primary Care Daelon Dunivan: PA TIENT, [place] Other Clinician: Referring Johnney Scarlata: Treating Junetta Hearn/Extender: Valentino Saxon in Treatment: 0 Foot Assessment Items Site Locations + = Sensation present, - = Sensation absent, C = Callus, U = Ulcer R = Redness, W = Warmth, M = Maceration, PU = Pre-ulcerative lesion F = Fissure, S = Swelling, D = Dryness Assessment Right: Left: Other Deformity: No No Prior Foot Ulcer: No No Prior Amputation: No No Charcot Joint: No No Ambulatory Status: Ambulatory Without Help Gait: Steady Electronic Signature(s) Signed: 04/10/2020 5:17:55 PM By: Cherylin Mylar Entered By: Cherylin Mylar on 04/10/2020  11:06:44 -------------------------------------------------------------------------------- Nutrition Risk Screening Details Patient Name: Date of Service: Abigail Meyer 04/10/2020 10:30 A M Medical Record Number: [id number] Patient Account Number: [id number] Date of Birth/Sex: Treating RN: [birth year] (29 y.o. [employer] Primary Care Wynette [place]: PA TIENT, NO Other Clinician: Referring Anyela Napierkowski: Treating Cesare Sumlin/Extender: Valentino Saxon in Treatment: 0 Height (in): 63 Weight (lbs): 130 Body Mass Index (BMI): 23 Nutrition Risk Screening Items Score Screening NUTRITION RISK SCREEN: I have an illness or condition that made me change the kind and/or amount of food I eat 0 No I eat fewer than two meals per day 0 No I eat few fruits and vegetables, or milk products 0 No I have three or more drinks of beer, liquor or wine almost every day 0 No I have tooth or mouth problems that make it hard for me to eat 0 No I don't always have enough money to buy the food I need 0 No I eat alone most of the time 0 No I take three or more different prescribed or over-the-counter drugs a day 1 Yes Without wanting to, I have lost or gained 10 pounds in the last six months 0 No I am not always physically able to shop, cook and/or feed myself 0 No Nutrition Protocols Good Risk Protocol 0 No interventions needed Moderate Risk Protocol High Risk Proctocol Risk Level: Good Risk Score: 1 Electronic Signature(s) Signed: 04/10/2020 5:17:55 PM By: Cherylin Mylar Entered By: Cherylin Mylar on 04/10/2020 11:06:37

## 2020-04-11 NOTE — Progress Notes (Addendum)
EMY, ANGEVINE ([id number]) Visit Report for 04/10/2020 Allergy List Details Patient Name: Date of Service: Rip Harbour 04/10/2020 10:30 A M Medical Record Number: [id number] Patient Account Number: [id number] Date of Birth/Sex: Treating RN: 1991/02/25 (29 y.o. Harvest Dark Primary Care Teryn Gust: PA Zenovia Jordan, [place] Other Clinician: Referring Nataliya Graig: Treating Bevely Hackbart/Extender: Valentino Saxon in Treatment: 0 Allergies Active Allergies No Known Allergies Allergy Notes Electronic Signature(s) Signed: 04/10/2020 5:17:55 PM By: Cherylin Mylar Entered By: Cherylin Mylar on 04/10/2020 10:59:38 -------------------------------------------------------------------------------- Arrival Information Details Patient Name: Date of Service: [name], [name] 04/10/2020 10:30 A M Medical Record Number: [id number] Patient Account Number: [id number] Date of Birth/Sex: Treating RN: 04-Apr-1991 (29 y.o. Harvest Dark Primary Care Parke Jandreau: PA Zenovia Jordan, [place] Other Clinician: Referring Alyshia Kernan: Treating Izac Faulkenberry/Extender: Valentino Saxon in Treatment: 0 Visit Information Patient Arrived: Ambulatory Arrival Time: 10:52 Accompanied By: self Transfer Assistance: None Patient Identification Verified: Yes Secondary Verification Process Completed: Yes Patient Requires Transmission-Based Precautions: No Electronic Signature(s) Signed: 04/10/2020 5:17:55 PM By: Cherylin Mylar Entered By: Cherylin Mylar on 04/10/2020 10:55:22 -------------------------------------------------------------------------------- Clinic Level of Care Assessment Details Patient Name: Date of Service: [place] 04/10/2020 10:30 A M Medical Record Number: [id number] Patient Account Number: [id number] Date of Birth/Sex: Treating RN: 1991-07-16 (29 y.o. Arta Silence Primary Care Leaann Nevils: PA Zenovia Jordan, [place] Other Clinician: Referring Charnice Zwilling: Treating Laurabelle Gorczyca/Extender: Valentino Saxon  in Treatment: 0 Clinic Level of Care Assessment Items TOOL 2 Quantity Score X- 1 0 Use when only an EandM is performed on the INITIAL visit ASSESSMENTS - Nursing Assessment / Reassessment X- 1 20 General Physical Exam (combine w/ comprehensive assessment (listed just below) when performed on new pt. evals) X- 1 25 Comprehensive Assessment (HX, ROS, Risk Assessments, Wounds Hx, etc.) ASSESSMENTS - Wound and Skin A ssessment / Reassessment []  - 0 Simple Wound Assessment / Reassessment - one wound X- 1 5 Complex Wound Assessment / Reassessment - multiple wounds X- 1 10 Dermatologic / Skin Assessment (not related to wound area) ASSESSMENTS - Ostomy and/or Continence Assessment and Care []  - 0 Incontinence Assessment and Management []  - 0 Ostomy Care Assessment and Management (repouching, etc.) PROCESS - Coordination of Care []  - 0 Simple Patient / Family Education for ongoing care X- 1 20 Complex (extensive) Patient / Family Education for ongoing care X- 1 10 Staff obtains , Records, T Results / Process Orders est []  - 0 Staff telephones HHA, Nursing Homes / Clarify orders / etc []  - 0 Routine Transfer to another Facility (non-emergent condition) []  - 0 Routine Hospital Admission (non-emergent condition) []  - 0 New Admissions / / Ordering NPWT Apligraf, etc. , []  - 0 Emergency Hospital Admission (emergent condition) []  - 0 Simple Discharge Coordination X- 1 15 Complex (extensive) Discharge Coordination PROCESS - Special Needs []  - 0 Pediatric / Minor Patient Management []  - 0 Isolation Patient Management []  - 0 Hearing / Language / Visual special needs []  - 0 Assessment of Community assistance (transportation, D/C planning, etc.) []  - 0 Additional assistance / Altered mentation []  - 0 Support Surface(s) Assessment (bed, cushion, seat, etc.) INTERVENTIONS - Wound Cleansing / Measurement X- 1 5 Wound Imaging (photographs - any  number of wounds) []  - 0 Wound Tracing (instead of photographs) []  - 0 Simple Wound Measurement - one wound X- 1 5 Complex Wound Measurement - multiple wounds []  - 0 Simple Wound Cleansing - one wound X- 1 5 Complex Wound Cleansing - multiple wounds INTERVENTIONS -  Wound Dressings X - Small Wound Dressing one or multiple wounds 1 10 []  - 0 Medium Wound Dressing one or multiple wounds []  - 0 Large Wound Dressing one or multiple wounds []  - 0 Application of Medications - injection INTERVENTIONS - Miscellaneous []  - 0 External ear exam []  - 0 Specimen Collection (cultures, biopsies, blood, body fluids, etc.) []  - 0 Specimen(s) / Culture(s) sent or taken to Lab for analysis []  - 0 Patient Transfer (multiple staff / Lift / Similar devices) []  - 0 Simple Staple / Suture removal (25 or less) []  - 0 Complex Staple / Suture removal (26 or more) []  - 0 Hypo / Hyperglycemic Management (close monitor of Blood Glucose) []  - 0 Ankle / Brachial Index (ABI) - do not check if billed separately Has the patient been seen at the hospital within the last three years: Yes Total Score: 130 Level Of Care: New/Established - Level 4 Electronic Signature(s) Signed: 04/10/2020 5:40:39 PM By: Entered By: on 04/10/2020 12:05:18 -------------------------------------------------------------------------------- Encounter Discharge Information Details Patient Name: Date of Service: , LITTA L. 04/10/2020 10:30 A M Medical Record Number: Patient Account Number: Date of Birth/Sex: Treating RN: [birth year] (29 y.o. Primary Care Karlita Lichtman: PA 06/10/2020, Shawn Stall Other Clinician: Referring Carolan Avedisian: Treating Maximo Spratling/Extender: Shawn Stall in Treatment: 0 Encounter Discharge Information Items Discharge Condition: Stable Ambulatory Status: Ambulatory Discharge Destination: Home Transportation: Private Auto Accompanied By:  self Schedule Follow-up Appointment: Yes Clinical Summary of Care: Electronic Signature(s) Signed: 04/10/2020 5:40:39 PM By: [name] Entered By: Marton Redwood on 04/10/2020 17:02:08 -------------------------------------------------------------------------------- Lower Extremity Assessment Details Patient Name: Date of Service: [id number] 04/10/2020 10:30 A M Medical Record Number: 11/01/1990 Patient Account Number: Arta Silence Date of Birth/Sex: Treating RN: 12/10/1990 (29 y.o. [place] Primary Care Kadra Kohan: PA Valentino Saxon, 06/10/2020 Other Clinician: Referring Taraneh Metheney: Treating Sarenity Ramaker/Extender: Shawn Stall in Treatment: 0 Electronic Signature(s) Signed: 04/10/2020 5:17:55 PM By: 06/10/2020 Entered By: Rip Harbour on 04/10/2020 11:07:03 -------------------------------------------------------------------------------- Multi Wound Chart Details Patient Name: Date of Service: [id number], [id number] LITTA L. 04/10/2020 10:30 A M Medical Record Number: Harvest Dark Patient Account Number: Zenovia Jordan Date of Birth/Sex: Treating RN: [birth year] (29 y.o. Valentino Saxon, 06/10/2020 Primary Care Simra Fiebig: PA TIENT, NO Other Clinician: Referring Arrow Emmerich: Treating Karson Reede/Extender: Cherylin Mylar in Treatment: 0 Vital Signs Height(in): 63 Pulse(bpm): 95 Weight(lbs): 130 Blood Pressure(mmHg): 114/68 Body Mass Index(BMI): 23 Temperature(F): 98.8 Respiratory Rate(breaths/min): 17 Photos: [1:No Photos Right, Posterior Upper Arm] [N/A:N/A N/A] Wound Location: [1:Trauma] [N/A:N/A] Wounding Event: [1:Open Surgical Wound] [N/A:N/A] Primary Etiology: [1:Trauma, Other] [N/A:N/A] Secondary Etiology: [1:Osteomyelitis] [N/A:N/A] Comorbid History: [1:02/13/2009] [N/A:N/A] Date Acquired: [1:0] [N/A:N/A] Weeks of Treatment: [1:Open] [N/A:N/A] Wound Status: [1:Yes] [N/A:N/A] Clustered Wound: [1:3] [N/A:N/A] Clustered Quantity: [1:2.8x3x0.4] [N/A:N/A] Measurements L x W x D (cm)  [1:6.597] [N/A:N/A] A (cm) : rea [1:2.639] [N/A:N/A] Volume (cm) : [1:0.00%] [N/A:N/A] % Reduction in A rea: [1:0.00%] [N/A:N/A] % Reduction in Volume: [1:6] Position 1 (o'clock): [1:5.5] Maximum Distance 1 (cm): [1:Yes] [N/A:N/A] Tunneling: [1:Full Thickness Without Exposed] [N/A:N/A] Classification: [1:Support Structures Medium] [N/A:N/A] Exudate Amount: [1:Serous] [N/A:N/A] Exudate Type: [1:amber] [N/A:N/A] Exudate Color: [1:Well defined, not attached] [N/A:N/A] Wound Margin: [1:Large (67-100%)] [N/A:N/A] Granulation Amount: [1:Pink] [N/A:N/A] Granulation Quality: [1:None Present (0%)] [N/A:N/A] Necrotic Amount: [1:Fat Layer (Subcutaneous Tissue): Yes N/A] Exposed Structures: [1:Fascia: No Tendon: No Muscle: No Joint: No Bone: No Small (1-33%)] [N/A:N/A] Treatment Notes Electronic Signature(s) Signed: 04/10/2020 5:40:39 PM By: [name] Signed: 04/11/2020 1:37:47 PM By:  Baltazar Najjarobson, Michael MD Entered By: Baltazar Najjarobson, Michael on 04/10/2020 12:21:28 -------------------------------------------------------------------------------- Multi-Disciplinary Care Plan Details Patient Name: Date of [place] 04/10/2020 10:30 A M Medical Record Number: [id number] Patient Account Number: [id number] Date of Birth/Sex: Treating RN: 06/21/1991 (29 y.o. Arta SilenceF) Deaton, Bobbi Primary Care Mariadelosang Wynns: PA Zenovia [place]IENT, [place] Other Clinician: Referring Jozlin Bently: Treating Kamarri Fischetti/Extender: Valentino Saxonobson, Michael Weeks in Treatment: 0 Active Inactive Electronic Signature(s) Signed: 04/23/2020 5:02:50 PM By: Shawn Stalleaton, Bobbi Previous Signature: 04/10/2020 5:40:39 PM Version By: Shawn Stalleaton, Bobbi Entered By: Shawn Stalleaton, Bobbi on 04/23/2020 17:02:49 -------------------------------------------------------------------------------- Pain Assessment Details Patient Name: Date of Service: Rip HarbourCHA NEY, LO LITTA L. 04/10/2020 10:30 A M Medical Record Number: [id number] Patient Account Number: [id number] Date of Birth/Sex: Treating  RN: 03/20/1991 (29 y.o. Harvest DarkF) Dwiggins, Shannon Primary Care Matayah Reyburn: PA Zenovia [place]IENT, [place] Other Clinician: Referring Ludmila Ebarb: Treating Samanth Mirkin/Extender: Valentino Saxonobson, Michael Weeks in Treatment: 0 Active Problems Location of Pain Severity and Description of Pain Patient Has Paino Yes Site Locations Pain Location: Pain in Ulcers With Dressing Change: Yes Duration of the Pain. Constant / Intermittento Constant Rate the pain. Current Pain Level: 5 Worst Pain Level: 10 Least Pain Level: 5 Tolerable Pain Level: 5 Character of Pain Describe the Pain: Aching, Shooting Pain Management and Medication Current Pain Management: Electronic Signature(s) Signed: 04/10/2020 5:17:55 PM By: Cherylin Mylarwiggins, Shannon Entered By: Cherylin Mylarwiggins, Shannon on 04/10/2020 11:17:12 -------------------------------------------------------------------------------- Patient/Caregiver Education Details Patient Name: Date of Service: Rip HarbourHA NEY, LO LITTA L. 9/9/2021andnbsp10:30 A M Medical Record Number: [id number] Patient Account Number: [id number] Date of Birth/Gender: Treating RN: 01/01/1991 (29 y.o. Arta SilenceF) Deaton, Bobbi Primary Care Physician: PA Zenovia [place]IENT, [place] Other Clinician: Referring Physician: Treating Physician/Extender: Valentino Saxonobson, Michael Weeks in Treatment: 0 Education Assessment Education Provided To: Patient Education Topics Provided [facility]: o Handouts: Welcome [facility] o Methods: Explain/Verbal Responses: Reinforcements needed Electronic Signature(s) Signed: 04/10/2020 5:40:39 PM By: Shawn Stalleaton, Bobbi Entered By: Shawn Stalleaton, Bobbi on 04/10/2020 11:57:19 -------------------------------------------------------------------------------- Wound Assessment Details Patient Name: Date of Service: Rip HarbourCHA NEY, LO LITTA L. 04/10/2020 10:30 A M Medical Record Number: [id number] Patient Account Number: [id number] Date of Birth/Sex: Treating RN: 08/07/1990 (29 y.o. Harvest DarkF) Dwiggins, Shannon Primary Care Pauline Pegues: PA  [place], [place] Other Clinician: Referring Anayelli Lai: Treating Tesla Keeler/Extender: Valentino Saxonobson, Michael Weeks in Treatment: 0 Wound Status Wound Number: 1 Primary Etiology: Open Surgical Wound Wound Location: Right, Posterior Upper Arm Secondary Etiology: Trauma, Other Wounding Event: Trauma Wound Status: Open Date Acquired: 02/13/2009 Comorbid History: Osteomyelitis Weeks Of Treatment: 0 Clustered Wound: Yes Photos Wound Measurements Length: (cm) 2.8 Width: (cm) 3 Depth: (cm) 0.4 Clustered Quantity: 3 Clustered Quantity: 3 Area: (cm) 6.5 Volume: (cm) 2.6 % Reduction in Area: 0% % Reduction in Volume: 0% Epithelialization: Small (1-33%) Tunneling: Yes Tunneling: Yes 97 Position (o'clock): 6 39 Maximum Distance: (cm) 5.5 Undermining: No Wound Description Classification: Full Thickness Without Exposed Support Structures Wound Margin: Well defined, not attached Exudate Amount: Medium Exudate Type: Serous Exudate Color: amber Foul Odor After Cleansing: No Slough/Fibrino No Wound Bed Granulation Amount: Large (67-100%) Exposed Structure Granulation Quality: Pink Fascia Exposed: No Necrotic Amount: None Present (0%) Fat Layer (Subcutaneous Tissue) Exposed: Yes Tendon Exposed: No Muscle Exposed: No Joint Exposed: No Bone Exposed: No Electronic Signature(s) Signed: 04/11/2020 4:40:23 PM By: Cherylin Mylarwiggins, Shannon Signed: 04/11/2020 4:59:26 PM By: Benjaman KindlerJones, Dedrick EMT/HBOT/SD Previous Signature: 04/10/2020 5:17:55 PM Version By: Cherylin Mylarwiggins, Shannon Previous Signature: 04/10/2020 5:40:39 PM Version By: Shawn Stalleaton, Bobbi Entered By: Benjaman KindlerJones, Dedrick on 04/11/2020 12:05:28 -------------------------------------------------------------------------------- Vitals Details Patient Name: Date of Service: CHA NEY, LO LITTA  L. 04/10/2020 10:30 A M Medical Record Number: [id number] Patient Account Number: [id number] Date of Birth/Sex: Treating RN: 12/12/1990 (29 y.o. Harvest Dark Primary Care Jennife Zaucha:  PA TIENT, [place] Other Clinician: Referring Yekaterina Escutia: Treating Kaleisha Bhargava/Extender: Valentino Saxon in Treatment: 0 Vital Signs Time Taken: 10:55 Temperature (F): 98.8 Height (in): 63 Pulse (bpm): 95 Source: Stated Respiratory Rate (breaths/min): 17 Weight (lbs): 130 Blood Pressure (mmHg): 114/68 Source: Stated Reference Range: 80 - 120 mg / dl Body Mass Index (BMI): 23 Electronic Signature(s) Signed: 04/10/2020 5:17:55 PM By: Cherylin Mylar Entered By: Cherylin Mylar on 04/10/2020 10:58:35
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
## 2020-04-14 ENCOUNTER — Telehealth (HOSPITAL_COMMUNITY): Payer: Self-pay | Admitting: Emergency Medicine

## 2020-04-14 MED ORDER — FLUCONAZOLE 150 MG PO TABS: 150.0000 mg | ORAL_TABLET | Freq: Once | ORAL | 0 refills | Status: AC

## 2020-04-14 MED ORDER — FLUCONAZOLE 150 MG PO TABS: 150.0000 mg | ORAL_TABLET | Freq: Once | ORAL | 0 refills | Status: DC

## 2020-04-17 ENCOUNTER — Encounter (HOSPITAL_BASED_OUTPATIENT_CLINIC_OR_DEPARTMENT_OTHER): Payer: Self-pay | Admitting: Internal Medicine

## 2020-04-18 ENCOUNTER — Ambulatory Visit (INDEPENDENT_AMBULATORY_CARE_PROVIDER_SITE_OTHER): Payer: Self-pay | Admitting: Orthopaedic Surgery

## 2020-04-18 ENCOUNTER — Encounter: Payer: Self-pay | Admitting: Orthopaedic Surgery

## 2020-04-18 DIAGNOSIS — M86421 Chronic osteomyelitis with draining sinus, right humerus: Secondary | ICD-10-CM | POA: Insufficient documentation
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
## 2020-04-21 NOTE — Addendum Note (Signed)
Addended by: Rogers Seeds on: 04/21/2020 09:46 AM   Modules accepted: Orders

## 2020-06-21 ENCOUNTER — Ambulatory Visit (HOSPITAL_COMMUNITY)
Admission: EM | Admit: 2020-06-21 | Discharge: 2020-06-21 | Disposition: A | Discharge status: Home / Self Care | Payer: Self-pay

## 2020-06-21 ENCOUNTER — Encounter (HOSPITAL_COMMUNITY): Payer: Self-pay

## 2020-06-21 ENCOUNTER — Other Ambulatory Visit: Payer: Self-pay

## 2020-06-21 DIAGNOSIS — L0291 Cutaneous abscess, unspecified: Secondary | ICD-10-CM | POA: Insufficient documentation

## 2020-06-21 DIAGNOSIS — N760 Acute vaginitis: Secondary | ICD-10-CM | POA: Insufficient documentation

## 2020-06-21 DIAGNOSIS — N898 Other specified noninflammatory disorders of vagina: Secondary | ICD-10-CM

## 2020-06-21 DIAGNOSIS — Z3202 Encounter for pregnancy test, result negative: Secondary | ICD-10-CM

## 2020-06-21 LAB — POCT URINALYSIS DIPSTICK, ED / UC
Bilirubin Urine: NEGATIVE
Glucose, UA: NEGATIVE mg/dL
Ketones, ur: NEGATIVE mg/dL
Leukocytes,Ua: NEGATIVE
Nitrite: NEGATIVE
Protein, ur: NEGATIVE mg/dL
Specific Gravity, Urine: 1.025 (ref 1.005–1.030)
Urobilinogen, UA: 0.2 mg/dL (ref 0.0–1.0)
pH: 5.5 (ref 5.0–8.0)

## 2020-06-21 LAB — POC URINE PREG, ED: Preg Test, Ur: NEGATIVE

## 2020-06-21 MED ORDER — DOXYCYCLINE HYCLATE 100 MG PO TABS: 100.0000 mg | ORAL_TABLET | Freq: Two times a day (BID) | ORAL | 0 refills | Status: DC

## 2020-06-21 MED ORDER — HIBICLENS 4 % EX LIQD: Freq: Every day | CUTANEOUS | 0 refills | Status: DC | PRN

## 2020-06-21 MED ORDER — NAPROXEN 375 MG PO TABS: 375.0000 mg | ORAL_TABLET | Freq: Two times a day (BID) | ORAL | 0 refills | Status: DC

## 2020-06-21 MED ORDER — FLUCONAZOLE 150 MG PO TABS: 150.0000 mg | ORAL_TABLET | Freq: Once | ORAL | 0 refills | Status: AC
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
## 2020-06-23 ENCOUNTER — Telehealth (HOSPITAL_COMMUNITY): Payer: Self-pay | Admitting: Emergency Medicine

## 2020-06-23 LAB — CERVICOVAGINAL ANCILLARY ONLY
Bacterial Vaginitis (gardnerella): POSITIVE — AB
Candida Glabrata: NEGATIVE
Candida Vaginitis: NEGATIVE
Chlamydia: NEGATIVE
Comment: NEGATIVE
Comment: NEGATIVE
Comment: NEGATIVE
Comment: NEGATIVE
Comment: NEGATIVE
Comment: NORMAL
Neisseria Gonorrhea: NEGATIVE
Trichomonas: NEGATIVE

## 2020-06-23 MED ORDER — METRONIDAZOLE 500 MG PO TABS: 500.0000 mg | ORAL_TABLET | Freq: Two times a day (BID) | ORAL | 0 refills | Status: DC

## 2020-07-16 ENCOUNTER — Other Ambulatory Visit: Payer: Self-pay

## 2020-07-16 ENCOUNTER — Encounter (HOSPITAL_COMMUNITY): Payer: Self-pay | Admitting: Emergency Medicine

## 2020-07-16 ENCOUNTER — Ambulatory Visit (HOSPITAL_COMMUNITY)
Admission: EM | Admit: 2020-07-16 | Discharge: 2020-07-16 | Disposition: A | Discharge status: Home / Self Care | Payer: Self-pay | Attending: Family Medicine | Admitting: Family Medicine

## 2020-07-16 DIAGNOSIS — N939 Abnormal uterine and vaginal bleeding, unspecified: Secondary | ICD-10-CM | POA: Insufficient documentation

## 2020-07-16 DIAGNOSIS — Z3202 Encounter for pregnancy test, result negative: Secondary | ICD-10-CM

## 2020-07-16 LAB — POCT URINALYSIS DIPSTICK, ED / UC
Bilirubin Urine: NEGATIVE
Glucose, UA: NEGATIVE mg/dL
Ketones, ur: NEGATIVE mg/dL
Leukocytes,Ua: NEGATIVE
Nitrite: NEGATIVE
Protein, ur: NEGATIVE mg/dL
Specific Gravity, Urine: >=1.03 (ref 1.005–1.030)
Urobilinogen, UA: 0.2 mg/dL (ref 0.0–1.0)
pH: 5.5 (ref 5.0–8.0)

## 2020-07-16 LAB — POC URINE PREG, ED: Preg Test, Ur: NEGATIVE

## 2020-07-16 MED ORDER — MEGESTROL ACETATE 40 MG PO TABS: 40.0000 mg | ORAL_TABLET | Freq: Every day | ORAL | 0 refills | Status: AC

## 2020-07-16 NOTE — Discharge Instructions (Signed)
We have sent testing for sexually transmitted infections. We will notify you of any positive results once they are received. If required, we will prescribe any medications you might need.  Please refrain from all sexual activity for at least the next seven days.  

## 2020-07-16 NOTE — ED Triage Notes (Signed)
Pt c/o abnormal vaginal bleeding x 2 weeks. Pt states she began her normal menstrual cycle and it never stopped. Pt states she has been also having abdominal cramping. Pt states she was diagnosed with BV and was taking the medication as prescribed.

## 2020-07-17 ENCOUNTER — Telehealth (HOSPITAL_COMMUNITY): Payer: Self-pay | Admitting: Emergency Medicine

## 2020-07-17 LAB — CERVICOVAGINAL ANCILLARY ONLY
Bacterial Vaginitis (gardnerella): POSITIVE — AB
Candida Glabrata: NEGATIVE
Candida Vaginitis: NEGATIVE
Chlamydia: NEGATIVE
Comment: NEGATIVE
Comment: NEGATIVE
Comment: NEGATIVE
Comment: NEGATIVE
Comment: NEGATIVE
Comment: NORMAL
Neisseria Gonorrhea: NEGATIVE
Trichomonas: NEGATIVE

## 2020-07-17 MED ORDER — METRONIDAZOLE 500 MG PO TABS: 500.0000 mg | ORAL_TABLET | Freq: Two times a day (BID) | ORAL | 0 refills | Status: DC

## 2020-07-18 ENCOUNTER — Telehealth (HOSPITAL_COMMUNITY): Payer: Self-pay | Admitting: Emergency Medicine

## 2020-07-18 MED ORDER — METRONIDAZOLE 500 MG PO TABS
500.0000 mg | ORAL_TABLET | Freq: Two times a day (BID) | ORAL | 0 refills | Status: DC
Start: 2020-07-18 — End: 2020-12-22

## 2020-07-18 NOTE — Telephone Encounter (Signed)
Patient wanted prescription sent to another pharmacy
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
## 2020-12-22 ENCOUNTER — Other Ambulatory Visit: Payer: Self-pay

## 2020-12-22 ENCOUNTER — Encounter (HOSPITAL_COMMUNITY): Payer: Self-pay

## 2020-12-22 ENCOUNTER — Ambulatory Visit (HOSPITAL_COMMUNITY)
Admission: EM | Admit: 2020-12-22 | Discharge: 2020-12-22 | Disposition: A | Discharge status: Home / Self Care | Payer: Self-pay | Attending: Emergency Medicine | Admitting: Emergency Medicine

## 2020-12-22 DIAGNOSIS — L0231 Cutaneous abscess of buttock: Secondary | ICD-10-CM

## 2020-12-22 DIAGNOSIS — Z113 Encounter for screening for infections with a predominantly sexual mode of transmission: Secondary | ICD-10-CM

## 2020-12-22 LAB — HIV ANTIBODY (ROUTINE TESTING W REFLEX): HIV Screen 4th Generation wRfx: NONREACTIVE

## 2020-12-22 MED ORDER — DOXYCYCLINE HYCLATE 100 MG PO TABS: 100.0000 mg | ORAL_TABLET | Freq: Two times a day (BID) | ORAL | 0 refills | Status: DC
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
## 2020-12-22 NOTE — Discharge Instructions (Addendum)
Take antibiotic twice daily for 7 days  Hold warm compresses to area at least four times a day, the more the better  Follow up for increased pain, swelling, warmth, fever, chills or area not draining  Labs result 2-3 days, you will be called if positive  Refrain from sex until lab results and/or treatment is complete

## 2020-12-22 NOTE — ED Triage Notes (Signed)
Pt presents with abscess in vaginal/buttocks area on right side X 4 days.  Pt also request STD testing.

## 2020-12-23 LAB — CERVICOVAGINAL ANCILLARY ONLY
Bacterial Vaginitis (gardnerella): POSITIVE — AB
Candida Glabrata: NEGATIVE
Candida Vaginitis: NEGATIVE
Chlamydia: NEGATIVE
Comment: NEGATIVE
Comment: NEGATIVE
Comment: NEGATIVE
Comment: NEGATIVE
Comment: NEGATIVE
Comment: NORMAL
Neisseria Gonorrhea: NEGATIVE
Trichomonas: NEGATIVE

## 2020-12-23 LAB — RPR: RPR Ser Ql: NONREACTIVE

## 2020-12-25 ENCOUNTER — Telehealth (HOSPITAL_COMMUNITY): Payer: Self-pay | Admitting: Emergency Medicine

## 2020-12-25 MED ORDER — METRONIDAZOLE 500 MG PO TABS: 500.0000 mg | ORAL_TABLET | Freq: Two times a day (BID) | ORAL | 0 refills | Status: DC

## 2021-07-07 ENCOUNTER — Ambulatory Visit (HOSPITAL_COMMUNITY)
Admission: EM | Admit: 2021-07-07 | Discharge: 2021-07-07 | Disposition: A | Discharge status: Home / Self Care | Payer: Self-pay | Attending: Physician Assistant | Admitting: Physician Assistant

## 2021-07-07 ENCOUNTER — Other Ambulatory Visit: Payer: Self-pay

## 2021-07-07 ENCOUNTER — Encounter (HOSPITAL_COMMUNITY): Payer: Self-pay

## 2021-07-07 DIAGNOSIS — Z113 Encounter for screening for infections with a predominantly sexual mode of transmission: Secondary | ICD-10-CM | POA: Insufficient documentation

## 2021-07-07 DIAGNOSIS — L739 Follicular disorder, unspecified: Secondary | ICD-10-CM | POA: Insufficient documentation

## 2021-07-07 MED ORDER — SULFAMETHOXAZOLE-TRIMETHOPRIM 800-160 MG PO TABS: 1.0000 | ORAL_TABLET | Freq: Two times a day (BID) | ORAL | 0 refills | Status: AC

## 2021-07-07 NOTE — ED Triage Notes (Signed)
Pt requested STD's.  Pt reports she has ab abscess in the genitalia on and off x 1 month.   Pt reports Staph infection in right arm x 10 years.
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
## 2021-07-07 NOTE — Discharge Instructions (Addendum)
Take medication as prescribed Apply warm compress 1-2 times per day Return if symptoms become worse

## 2021-07-09 LAB — CERVICOVAGINAL ANCILLARY ONLY
Bacterial Vaginitis (gardnerella): NEGATIVE
Candida Glabrata: NEGATIVE
Candida Vaginitis: NEGATIVE
Chlamydia: NEGATIVE
Comment: NEGATIVE
Comment: NEGATIVE
Comment: NEGATIVE
Comment: NEGATIVE
Comment: NEGATIVE
Comment: NORMAL
Neisseria Gonorrhea: NEGATIVE
Trichomonas: NEGATIVE

## 2021-12-10 ENCOUNTER — Encounter (HOSPITAL_COMMUNITY): Payer: Self-pay

## 2021-12-10 ENCOUNTER — Ambulatory Visit (HOSPITAL_COMMUNITY)
Admission: EM | Admit: 2021-12-10 | Discharge: 2021-12-10 | Disposition: A | Discharge status: Home / Self Care | Payer: Self-pay | Attending: Internal Medicine | Admitting: Internal Medicine

## 2021-12-10 DIAGNOSIS — Z202 Contact with and (suspected) exposure to infections with a predominantly sexual mode of transmission: Secondary | ICD-10-CM

## 2021-12-10 DIAGNOSIS — Z113 Encounter for screening for infections with a predominantly sexual mode of transmission: Secondary | ICD-10-CM

## 2021-12-10 LAB — POC URINE PREG, ED: Preg Test, Ur: NEGATIVE

## 2021-12-10 MED ORDER — FLUCONAZOLE 150 MG PO TABS: 150.0000 mg | ORAL_TABLET | Freq: Every day | ORAL | 0 refills | Status: DC

## 2021-12-10 MED ORDER — DOXYCYCLINE HYCLATE 100 MG PO CAPS: 100.0000 mg | ORAL_CAPSULE | Freq: Two times a day (BID) | ORAL | 0 refills | Status: DC

## 2021-12-10 MED ORDER — CEFTRIAXONE SODIUM 500 MG IJ SOLR
INTRAMUSCULAR | Status: AC
  Filled 2021-12-10: qty 500

## 2021-12-10 MED ORDER — CEFTRIAXONE SODIUM 500 MG IJ SOLR
500.0000 mg | Freq: Once | INTRAMUSCULAR | Status: AC
  Administered 2021-12-10: 500 mg via INTRAMUSCULAR
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
## 2021-12-10 NOTE — ED Triage Notes (Signed)
Pt was notified today that she was exposed to gonorrhea and chlamydia. Pt notes approx 2wk h/o vaginal itching, irritation and some discharge. Pt requesting meds for yeast infection if she is placed on antibiotics.  ?

## 2021-12-10 NOTE — Discharge Instructions (Signed)
Urine pregnancy test was negative.  We are treating you for gonorrhea and chlamydia exposure today.  Please refrain from sexual activity until symptoms and treatment are complete.  Please follow-up if symptoms persist or worsen ?

## 2021-12-11 ENCOUNTER — Telehealth (HOSPITAL_COMMUNITY): Payer: Self-pay | Admitting: Emergency Medicine

## 2021-12-11 LAB — CERVICOVAGINAL ANCILLARY ONLY
Bacterial Vaginitis (gardnerella): POSITIVE — AB
Candida Glabrata: NEGATIVE
Candida Vaginitis: NEGATIVE
Chlamydia: NEGATIVE
Comment: NEGATIVE
Comment: NEGATIVE
Comment: NEGATIVE
Comment: NEGATIVE
Comment: NEGATIVE
Comment: NORMAL
Neisseria Gonorrhea: NEGATIVE
Trichomonas: NEGATIVE

## 2021-12-11 MED ORDER — METRONIDAZOLE 500 MG PO TABS: 500.0000 mg | ORAL_TABLET | Freq: Two times a day (BID) | ORAL | 0 refills | Status: DC

## 2022-05-10 ENCOUNTER — Encounter (HOSPITAL_COMMUNITY): Payer: Self-pay

## 2022-05-10 ENCOUNTER — Ambulatory Visit (HOSPITAL_COMMUNITY)
Admission: EM | Admit: 2022-05-10 | Discharge: 2022-05-10 | Disposition: A | Discharge status: Home / Self Care | Payer: Self-pay | Attending: Physician Assistant | Admitting: Physician Assistant

## 2022-05-10 DIAGNOSIS — N760 Acute vaginitis: Secondary | ICD-10-CM | POA: Insufficient documentation

## 2022-05-10 DIAGNOSIS — M79601 Pain in right arm: Secondary | ICD-10-CM | POA: Insufficient documentation

## 2022-05-10 LAB — POCT URINALYSIS DIPSTICK, ED / UC
Bilirubin Urine: NEGATIVE
Glucose, UA: NEGATIVE mg/dL
Ketones, ur: NEGATIVE mg/dL
Leukocytes,Ua: NEGATIVE
Nitrite: NEGATIVE
Protein, ur: NEGATIVE mg/dL
Specific Gravity, Urine: 1.025 (ref 1.005–1.030)
Urobilinogen, UA: 0.2 mg/dL (ref 0.0–1.0)
pH: 6 (ref 5.0–8.0)

## 2022-05-10 LAB — POC URINE PREG, ED: Preg Test, Ur: NEGATIVE

## 2022-05-10 MED ORDER — KETOROLAC TROMETHAMINE 30 MG/ML IJ SOLN
30.0000 mg | Freq: Once | INTRAMUSCULAR | Status: AC
  Administered 2022-05-10: 30 mg via INTRAMUSCULAR

## 2022-05-10 MED ORDER — KETOROLAC TROMETHAMINE 30 MG/ML IJ SOLN
INTRAMUSCULAR | Status: AC
  Filled 2022-05-10: qty 1

## 2022-05-10 MED ORDER — FLUCONAZOLE 150 MG PO TABS: 150.0000 mg | ORAL_TABLET | Freq: Every day | ORAL | 0 refills | Status: DC

## 2022-05-10 MED ORDER — METRONIDAZOLE 500 MG PO TABS: 500.0000 mg | ORAL_TABLET | Freq: Two times a day (BID) | ORAL | 0 refills | Status: DC
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
## 2022-05-10 NOTE — Discharge Instructions (Signed)
Take medication as prescribed We will call you with test results and send in any additional medications if needed Abstain from sexual activity until test results are back and you have completed treatment.
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
## 2022-05-11 LAB — CERVICOVAGINAL ANCILLARY ONLY
Bacterial Vaginitis (gardnerella): POSITIVE — AB
Candida Glabrata: NEGATIVE
Candida Vaginitis: POSITIVE — AB
Chlamydia: NEGATIVE
Comment: NEGATIVE
Comment: NEGATIVE
Comment: NEGATIVE
Comment: NEGATIVE
Comment: NEGATIVE
Comment: NORMAL
Neisseria Gonorrhea: NEGATIVE
Trichomonas: NEGATIVE

## 2022-05-13 ENCOUNTER — Encounter: Payer: Self-pay | Admitting: Emergency Medicine

## 2022-07-20 ENCOUNTER — Ambulatory Visit (HOSPITAL_COMMUNITY)
Admission: EM | Admit: 2022-07-20 | Discharge: 2022-07-20 | Disposition: A | Discharge status: Home / Self Care | Payer: Self-pay | Attending: Emergency Medicine | Admitting: Emergency Medicine

## 2022-07-20 ENCOUNTER — Encounter (HOSPITAL_COMMUNITY): Payer: Self-pay

## 2022-07-20 DIAGNOSIS — N898 Other specified noninflammatory disorders of vagina: Secondary | ICD-10-CM

## 2022-07-20 LAB — HIV ANTIBODY (ROUTINE TESTING W REFLEX): HIV Screen 4th Generation wRfx: NONREACTIVE

## 2022-07-20 MED ORDER — FLUCONAZOLE 150 MG PO TABS: ORAL_TABLET | ORAL | 0 refills | Status: DC

## 2022-07-20 NOTE — ED Triage Notes (Signed)
Pt c/o vaginal irritation with a slight discharge for 2 days. Pt requesting routine STD check and blood work.
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
## 2022-07-20 NOTE — Discharge Instructions (Signed)
We will call you if any of your test results warrant a change in your plan of care, you may view these test results on MyChart.  Diflucan has been sent to the pharmacy, you may take 1 tablet today and 1 tablet 3 days from today.   Please refrain from any sexual activity until receiving all your test results and finishing the Diflucan prescription.

## 2022-07-21 ENCOUNTER — Telehealth (HOSPITAL_COMMUNITY): Payer: Self-pay | Admitting: Emergency Medicine

## 2022-07-21 LAB — CERVICOVAGINAL ANCILLARY ONLY
Bacterial Vaginitis (gardnerella): POSITIVE — AB
Candida Glabrata: NEGATIVE
Candida Vaginitis: POSITIVE — AB
Chlamydia: NEGATIVE
Comment: NEGATIVE
Comment: NEGATIVE
Comment: NEGATIVE
Comment: NEGATIVE
Comment: NEGATIVE
Comment: NORMAL
Neisseria Gonorrhea: NEGATIVE
Trichomonas: NEGATIVE

## 2022-07-21 LAB — RPR: RPR Ser Ql: NONREACTIVE

## 2022-07-21 MED ORDER — METRONIDAZOLE 500 MG PO TABS: 500.0000 mg | ORAL_TABLET | Freq: Two times a day (BID) | ORAL | 0 refills | Status: DC

## 2022-10-28 ENCOUNTER — Encounter (HOSPITAL_COMMUNITY): Payer: Self-pay

## 2022-10-28 ENCOUNTER — Ambulatory Visit (HOSPITAL_COMMUNITY)
Admission: EM | Admit: 2022-10-28 | Discharge: 2022-10-28 | Disposition: A | Discharge status: Home / Self Care | Payer: Self-pay | Attending: Physician Assistant | Admitting: Physician Assistant

## 2022-10-28 DIAGNOSIS — N939 Abnormal uterine and vaginal bleeding, unspecified: Secondary | ICD-10-CM | POA: Insufficient documentation

## 2022-10-28 LAB — CBC
HCT: 40 % (ref 36.0–46.0)
Hemoglobin: 13.1 g/dL (ref 12.0–15.0)
MCH: 27.5 pg (ref 26.0–34.0)
MCHC: 32.8 g/dL (ref 30.0–36.0)
MCV: 84 fL (ref 80.0–100.0)
Platelets: 323 10*3/uL (ref 150–400)
RBC: 4.76 MIL/uL (ref 3.87–5.11)
RDW: 14.1 % (ref 11.5–15.5)
WBC: 9.5 10*3/uL (ref 4.0–10.5)
nRBC: 0 % (ref 0.0–0.2)

## 2022-10-28 LAB — COMPREHENSIVE METABOLIC PANEL
ALT: 15 U/L (ref 0–44)
AST: 19 U/L (ref 15–41)
Albumin: 3.5 g/dL (ref 3.5–5.0)
Alkaline Phosphatase: 73 U/L (ref 38–126)
Anion gap: 9 (ref 5–15)
BUN: 6 mg/dL (ref 6–20)
CO2: 24 mmol/L (ref 22–32)
Calcium: 9 mg/dL (ref 8.9–10.3)
Chloride: 101 mmol/L (ref 98–111)
Creatinine, Ser: 0.72 mg/dL (ref 0.44–1.00)
GFR, Estimated: >60 mL/min (ref >60)
Glucose, Bld: 62 mg/dL — ABNORMAL LOW (ref 70–99)
Potassium: 3.9 mmol/L (ref 3.5–5.1)
Sodium: 134 mmol/L — ABNORMAL LOW (ref 135–145)
Total Bilirubin: 0.3 mg/dL (ref 0.3–1.2)
Total Protein: 7.9 g/dL (ref 6.5–8.1)

## 2022-10-28 LAB — POCT URINALYSIS DIPSTICK, ED / UC
Bilirubin Urine: NEGATIVE
Glucose, UA: NEGATIVE mg/dL
Ketones, ur: NEGATIVE mg/dL
Nitrite: NEGATIVE
Protein, ur: NEGATIVE mg/dL
Specific Gravity, Urine: 1.02 (ref 1.005–1.030)
Urobilinogen, UA: 0.2 mg/dL (ref 0.0–1.0)
pH: 6 (ref 5.0–8.0)

## 2022-10-28 LAB — TSH: TSH: 1.937 u[IU]/mL (ref 0.350–4.500)

## 2022-10-28 LAB — POC URINE PREG, ED: Preg Test, Ur: NEGATIVE

## 2022-10-28 MED ORDER — MEGESTROL ACETATE 40 MG PO TABS: 40.0000 mg | ORAL_TABLET | Freq: Every day | ORAL | 0 refills | Status: DC

## 2022-10-28 NOTE — ED Triage Notes (Signed)
Pt is here for STD-testing. Pt denies any vaginal discharge but has vaginal bleeding.

## 2022-10-28 NOTE — ED Triage Notes (Signed)
Also, pt states this is her second period this month.
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
## 2022-10-28 NOTE — Discharge Instructions (Addendum)
Start Megace daily for 14 days to stop abnormal bleeding.  As we discussed, this is a temporary fix and as soon as you stop the medication you will have withdrawal bleeding.  Please follow-up with OB/GYN as soon as possible.  If you have any changing or worsening symptoms including pelvic pain, abdominal pain, heavy bleeding where you has to change her pad/tampon every few hours, lightheadedness, weakness, fatigue you should be seen immediately.  We will contact you if any of your lab work is abnormal.

## 2022-10-29 LAB — CERVICOVAGINAL ANCILLARY ONLY
Bacterial Vaginitis (gardnerella): POSITIVE — AB
Candida Glabrata: NEGATIVE
Candida Vaginitis: POSITIVE — AB
Chlamydia: NEGATIVE
Comment: NEGATIVE
Comment: NEGATIVE
Comment: NEGATIVE
Comment: NEGATIVE
Comment: NEGATIVE
Comment: NORMAL
Neisseria Gonorrhea: NEGATIVE
Trichomonas: POSITIVE — AB

## 2022-10-29 LAB — URINE CULTURE: Culture: NO GROWTH

## 2022-11-01 ENCOUNTER — Telehealth (HOSPITAL_COMMUNITY): Payer: Self-pay | Admitting: Emergency Medicine

## 2022-11-01 MED ORDER — FLUCONAZOLE 150 MG PO TABS: 150.0000 mg | ORAL_TABLET | Freq: Once | ORAL | 0 refills | Status: AC

## 2022-11-01 MED ORDER — METRONIDAZOLE 500 MG PO TABS: 500.0000 mg | ORAL_TABLET | Freq: Two times a day (BID) | ORAL | 0 refills | Status: DC

## 2022-11-01 NOTE — Telephone Encounter (Signed)
Opened in error

## 2022-11-04 ENCOUNTER — Telehealth (HOSPITAL_COMMUNITY): Payer: Self-pay

## 2022-11-04 MED ORDER — METRONIDAZOLE 500 MG PO TABS: 500.0000 mg | ORAL_TABLET | Freq: Two times a day (BID) | ORAL | 0 refills | Status: DC

## 2022-11-04 MED ORDER — FLUCONAZOLE 150 MG PO TABS: 150.0000 mg | ORAL_TABLET | Freq: Every day | ORAL | 0 refills | Status: DC

## 2023-02-28 ENCOUNTER — Encounter (HOSPITAL_COMMUNITY): Payer: Self-pay

## 2023-02-28 ENCOUNTER — Ambulatory Visit (HOSPITAL_COMMUNITY)
Admission: EM | Admit: 2023-02-28 | Discharge: 2023-02-28 | Disposition: A | Discharge status: Home / Self Care | Payer: Self-pay | Attending: Emergency Medicine | Admitting: Emergency Medicine

## 2023-02-28 DIAGNOSIS — N898 Other specified noninflammatory disorders of vagina: Secondary | ICD-10-CM | POA: Insufficient documentation

## 2023-02-28 DIAGNOSIS — N949 Unspecified condition associated with female genital organs and menstrual cycle: Secondary | ICD-10-CM | POA: Insufficient documentation

## 2023-02-28 MED ORDER — METRONIDAZOLE 500 MG PO TABS: 500.0000 mg | ORAL_TABLET | Freq: Two times a day (BID) | ORAL | 0 refills | Status: DC

## 2023-02-28 MED ORDER — FLUCONAZOLE 150 MG PO TABS: 150.0000 mg | ORAL_TABLET | Freq: Every day | ORAL | 0 refills | Status: DC
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
## 2023-02-28 NOTE — Discharge Instructions (Addendum)
Today you are being treated prophylactically for  Bacterial vaginosis and yeast  On exam lesion of concern appears to be a hair bump, low suspicion that this is herpes but a sample has been obtained, you will be notified of positive results only  Take Metronidazole 500 mg twice a day for 7 days, do not drink alcohol while using medication, this will make you feel sick   Take 1 Diflucan tablet when you receive your medicine then after completion of all metronidazole you may take the second dose  Bacterial vaginosis  and yeast result  from an overgrowth of one on several organisms that are normally present in your vagina. Vaginosis is an inflammation of the vagina that can result in discharge, itching and pain.  Labs pending 2-3 days, you will be contacted if positive for any sti and treatment will be sent to the pharmacy, you will have to return to the clinic if positive for gonorrhea to receive treatment   Please refrain from having sex until labs results, if positive please refrain from having sex until treatment complete and symptoms resolve   If positive for , Chlamydia  gonorrhea or trichomoniasis please notify partner or partners so they may tested as well  Moving forward, it is recommended you use some form of protection against the transmission of sti infections  such as condoms or dental dams with each sexual encounter     In addition: Avoid baths, hot tubs and whirlpool spas.  Don't use scented or harsh soaps Avoid irritants. These include scented tampons and pads. Wipe from front to back after using the toilet. Don't douche. Your vagina doesn't require cleansing other than normal bathing.  Use a condom.  Wear cotton underwear, this fabric absorbs some moisture.

## 2023-02-28 NOTE — ED Triage Notes (Signed)
Patient here today with c/o abscess on the right side of her vagina X 2 days. The area is swollen and painful.   Patient would also like to be tested for all STDs.

## 2023-03-02 ENCOUNTER — Telehealth (HOSPITAL_COMMUNITY): Payer: Self-pay | Admitting: Emergency Medicine

## 2023-03-02 NOTE — Telephone Encounter (Signed)
Opened in error

## 2023-03-22 ENCOUNTER — Ambulatory Visit (HOSPITAL_COMMUNITY)
Admission: EM | Admit: 2023-03-22 | Discharge: 2023-03-22 | Disposition: A | Discharge status: Home / Self Care | Payer: Self-pay | Attending: Nurse Practitioner | Admitting: Nurse Practitioner

## 2023-03-22 ENCOUNTER — Encounter (HOSPITAL_COMMUNITY): Payer: Self-pay | Admitting: *Deleted

## 2023-03-22 DIAGNOSIS — N898 Other specified noninflammatory disorders of vagina: Secondary | ICD-10-CM | POA: Insufficient documentation

## 2023-03-22 LAB — HIV ANTIBODY (ROUTINE TESTING W REFLEX): HIV Screen 4th Generation wRfx: NONREACTIVE

## 2023-03-22 MED ORDER — FLUCONAZOLE 150 MG PO TABS: 150.0000 mg | ORAL_TABLET | Freq: Every day | ORAL | 0 refills | Status: AC

## 2023-03-22 NOTE — Discharge Instructions (Signed)
We are testing you today for gonorrhea, chlamydia, trichomonas, BV, and yeast infection.  We are treating you for a yeast infection.  Take the fluconazole today and then repeat in 72 hours.  Seek care if symptoms persist or worsen despite treatment.

## 2023-03-22 NOTE — ED Triage Notes (Signed)
Pt states she was treated for BV and yeast last week and she is still having discharge and itching. She states she finished the meds.
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
## 2023-03-23 LAB — RPR: RPR Ser Ql: NONREACTIVE

## 2023-03-23 LAB — CERVICOVAGINAL ANCILLARY ONLY
Bacterial Vaginitis (gardnerella): POSITIVE — AB
Candida Glabrata: NEGATIVE
Candida Vaginitis: POSITIVE — AB
Chlamydia: NEGATIVE
Comment: NEGATIVE
Comment: NEGATIVE
Comment: NEGATIVE
Comment: NEGATIVE
Comment: NEGATIVE
Comment: NORMAL
Neisseria Gonorrhea: NEGATIVE
Trichomonas: NEGATIVE

## 2023-03-24 ENCOUNTER — Telehealth: Payer: Self-pay

## 2023-03-24 MED ORDER — METRONIDAZOLE 0.75 % VA GEL: 1.0000 | Freq: Every day | VAGINAL | Status: AC

## 2023-03-24 NOTE — Telephone Encounter (Signed)
Per protocol, pt requires tx with metronidazole. Reviewed with patient, verified pharmacy, prescription sent.

## 2023-04-25 ENCOUNTER — Other Ambulatory Visit: Payer: Self-pay

## 2023-04-25 ENCOUNTER — Emergency Department (HOSPITAL_COMMUNITY)
Admission: EM | Admit: 2023-04-25 | Discharge: 2023-04-26 | Disposition: A | Discharge status: Home / Self Care | Payer: Self-pay | Attending: Emergency Medicine | Admitting: Emergency Medicine

## 2023-04-25 ENCOUNTER — Encounter (HOSPITAL_COMMUNITY): Payer: Self-pay

## 2023-04-25 DIAGNOSIS — R1010 Upper abdominal pain, unspecified: Secondary | ICD-10-CM | POA: Insufficient documentation

## 2023-04-25 DIAGNOSIS — M79601 Pain in right arm: Secondary | ICD-10-CM

## 2023-04-25 DIAGNOSIS — R1110 Vomiting, unspecified: Secondary | ICD-10-CM | POA: Diagnosis not present

## 2023-04-25 LAB — COMPREHENSIVE METABOLIC PANEL
ALT: 86 U/L — ABNORMAL HIGH (ref 0–44)
AST: 211 U/L — ABNORMAL HIGH (ref 15–41)
Albumin: 3.6 g/dL (ref 3.5–5.0)
Alkaline Phosphatase: 97 U/L (ref 38–126)
Anion gap: 11 (ref 5–15)
BUN: 11 mg/dL (ref 6–20)
CO2: 21 mmol/L — ABNORMAL LOW (ref 22–32)
Calcium: 8.7 mg/dL — ABNORMAL LOW (ref 8.9–10.3)
Chloride: 104 mmol/L (ref 98–111)
Creatinine, Ser: 0.76 mg/dL (ref 0.44–1.00)
GFR, Estimated: >60 mL/min (ref >60)
Glucose, Bld: 114 mg/dL — ABNORMAL HIGH (ref 70–99)
Potassium: 3.8 mmol/L (ref 3.5–5.1)
Sodium: 136 mmol/L (ref 135–145)
Total Bilirubin: 0.9 mg/dL (ref 0.3–1.2)
Total Protein: 7.7 g/dL (ref 6.5–8.1)

## 2023-04-25 LAB — CBC
HCT: 38.2 % (ref 36.0–46.0)
Hemoglobin: 12.9 g/dL (ref 12.0–15.0)
MCH: 27.9 pg (ref 26.0–34.0)
MCHC: 33.8 g/dL (ref 30.0–36.0)
MCV: 82.5 fL (ref 80.0–100.0)
Platelets: 301 10*3/uL (ref 150–400)
RBC: 4.63 MIL/uL (ref 3.87–5.11)
RDW: 14.3 % (ref 11.5–15.5)
WBC: 12.6 10*3/uL — ABNORMAL HIGH (ref 4.0–10.5)
nRBC: 0 % (ref 0.0–0.2)

## 2023-04-25 LAB — LIPASE, BLOOD: Lipase: 19 U/L (ref 11–51)

## 2023-04-25 LAB — HCG, SERUM, QUALITATIVE: Preg, Serum: NEGATIVE

## 2023-04-25 MED ORDER — ONDANSETRON HCL 4 MG/2ML IJ SOLN
4.0000 mg | Freq: Once | INTRAMUSCULAR | Status: AC | PRN
  Administered 2023-04-25: 4 mg via INTRAVENOUS
  Filled 2023-04-25: qty 2

## 2023-04-25 NOTE — ED Triage Notes (Signed)
Pt coming in with complaint of upper abdominal pain starting earlier today. Pt states pain started suddenly after eating a [place] and egg sandwich. Pt reports 1 episode of vomiting with no improvement in pain. Denies diarrhea, fevers.

## 2023-04-26 ENCOUNTER — Emergency Department (HOSPITAL_COMMUNITY): Payer: BLUE CROSS/BLUE SHIELD

## 2023-04-26 MED ORDER — FAMOTIDINE IN NACL 20-0.9 MG/50ML-% IV SOLN
20.0000 mg | Freq: Once | INTRAVENOUS | Status: AC
  Administered 2023-04-26: 20 mg via INTRAVENOUS
  Filled 2023-04-26: qty 50

## 2023-04-26 MED ORDER — ONDANSETRON 4 MG PO TBDP: 4.0000 mg | ORAL_TABLET | Freq: Three times a day (TID) | ORAL | 0 refills | Status: DC | PRN

## 2023-04-26 MED ORDER — FENTANYL CITRATE PF 50 MCG/ML IJ SOSY
50.0000 ug | PREFILLED_SYRINGE | Freq: Once | INTRAMUSCULAR | Status: AC
  Administered 2023-04-26: 50 ug via INTRAVENOUS
  Filled 2023-04-26: qty 1

## 2023-04-26 MED ORDER — METOCLOPRAMIDE HCL 5 MG/ML IJ SOLN
10.0000 mg | INTRAMUSCULAR | Status: AC
  Administered 2023-04-26: 10 mg via INTRAVENOUS
  Filled 2023-04-26: qty 2

## 2023-04-26 MED ORDER — SODIUM CHLORIDE 0.9 % IV BOLUS
1000.0000 mL | Freq: Once | INTRAVENOUS | Status: AC
  Administered 2023-04-26: 1000 mL via INTRAVENOUS

## 2023-04-26 MED ORDER — DOXYCYCLINE HYCLATE 100 MG PO TABS
100.0000 mg | ORAL_TABLET | Freq: Once | ORAL | Status: AC
  Administered 2023-04-26: 100 mg via ORAL
  Filled 2023-04-26: qty 1

## 2023-04-26 MED ORDER — LACTATED RINGERS IV BOLUS: 1000.0000 mL | Freq: Once | INTRAVENOUS | Status: DC

## 2023-04-26 MED ORDER — DOXYCYCLINE HYCLATE 100 MG PO CAPS: 100.0000 mg | ORAL_CAPSULE | Freq: Two times a day (BID) | ORAL | 0 refills | Status: AC

## 2023-04-26 NOTE — Discharge Instructions (Addendum)
You did have some elevation in your liver enzymes today, however, your ultrasound does not show any signs of gallbladder stones or liver problems.   You have been prescribed doxycyline. Take this antibiotic 2 times a day for the next 10 days. Take the full course of your antibiotic even if you start feeling better. Antibiotics may cause you to have diarrhea.  You were given your first dose of doxycycline here in the ER.  You may take your next dose tonight at 8 PM.  You have been prescribed Zofran to use as needed for nausea.  You may use up to 800mg  ibuprofen every 8 hours as needed for pain.  Do not exceed 2.4g of ibuprofen per day.  You may take up to 1000mg  of tylenol every 6 hours as needed for pain.  Do not take more then 4g per day.  Return to ER if you have uncontrolled nausea or vomiting, worsening abdominal pain, unexplained fever or chills, dizziness, any other new or concerning symptoms.

## 2023-04-26 NOTE — ED Provider Notes (Signed)
[place] EMERGENCY DEPARTMENT AT [facility] Provider Note   CSN: [id number] Arrival date & time: 04/25/23  2007     History  Chief Complaint  Patient presents with   Abdominal Pain    Abigail Meyer is a 32 y.o. female.  32 year old female presents to the emergency department for evaluation of upper abdominal pain.  Her symptoms began in the afternoon after eating a [place] and eggs sandwich.  She reports a pressure-like sensation in her upper abdomen.  She had nausea as well as 1 episode of emesis prior to arrival.  States that her symptoms have spontaneously improved, though she continues to have some upper abdominal discomfort.  No hematemesis, diarrhea, fevers, urinary symptoms.  No prior abdominal surgeries.  The history is provided by the patient. No language interpreter was used.  Abdominal Pain      Home Medications Prior to Admission medications   Not on File      Allergies    Patient has no known allergies.    Review of Systems   Review of Systems  Gastrointestinal:  Positive for abdominal pain.  Ten systems reviewed and are negative for acute change, except as noted in the HPI.    Physical Exam Updated Vital Signs BP 91/61   Pulse 61   Temp 97.8 F (36.6 C)   Resp 16   Ht 5\' 2"  (1.575 m)   Wt 56.7 kg   SpO2 100%   BMI 22.86 kg/m   Physical Exam Vitals and nursing note reviewed.  Constitutional:      General: She is not in acute distress.    Appearance: She is well-developed. She is not diaphoretic.     Comments: Nontoxic appearing and in NAD  HENT:     Head: Normocephalic and atraumatic.  Eyes:     General: No scleral icterus.    Extraocular Movements: EOM normal.     Conjunctiva/sclera: Conjunctivae normal.  Cardiovascular:     Rate and Rhythm: Normal rate and regular rhythm.     Pulses: Normal pulses.  Pulmonary:     Effort: Pulmonary effort is normal. No respiratory distress.     Breath sounds: No stridor. No wheezing.      Comments: Respirations even and unlabored Abdominal:     Palpations: Abdomen is soft. There is no mass.     Tenderness: There is abdominal tenderness (upper abdomen). There is no guarding.     Comments: Negative Murphy's sign. No palpable masses or peritoneal signs.  Musculoskeletal:        General: Normal range of motion.     Cervical back: Normal range of motion.  Skin:    General: Skin is warm and dry.     Coloration: Skin is not pale.     Findings: No erythema or rash.  Neurological:     Mental Status: She is alert and oriented to person, place, and time.  Psychiatric:        Mood and Affect: Mood and affect normal.        Behavior: Behavior normal.     ED Results / Procedures / Treatments   Labs (all labs ordered are listed, but only abnormal results are displayed) Labs Reviewed  COMPREHENSIVE METABOLIC PANEL - Abnormal; Notable for the following components:      Result Value   CO2 21 (*)    Glucose, Bld 114 (*)    Calcium 8.7 (*)    AST 211 (*)    ALT 86 (*)  All other components within normal limits  CBC - Abnormal; Notable for the following components:   WBC 12.6 (*)    All other components within normal limits  LIPASE, BLOOD  HCG, SERUM, QUALITATIVE  URINALYSIS, ROUTINE W REFLEX MICROSCOPIC    EKG None  Radiology No results found.  Procedures Procedures    Medications Ordered in ED Medications  sodium chloride 0.9 % bolus 1,000 mL (has no administration in time range)  fentaNYL (SUBLIMAZE) injection 50 mcg (has no administration in time range)  metoCLOPramide (REGLAN) injection 10 mg (has no administration in time range)  famotidine (PEPCID) IVPB 20 mg premix (has no administration in time range)  ondansetron (ZOFRAN) injection 4 mg (4 mg Intravenous Given 04/25/23 2129)    ED Course/ Medical Decision Making/ A&P                                 Medical Decision Making Amount and/or Complexity of Data Reviewed Labs: ordered. Radiology:  ordered.  Risk Prescription drug management.   This patient presents to the ED for concern of upper abdominal pain, this involves an extensive number of treatment options, and is a complaint that carries with it a high risk of complications and morbidity.  The differential diagnosis includes gastritis/PUD vs pancreatitis vs biliary pathology vs pSBO/SBO vs ruptured viscous   Co morbidities that complicate the patient evaluation  None known   Additional history obtained:  Additional history obtained from partner, at bedside   Lab Tests:  I Ordered, and personally interpreted labs.  The pertinent results include:  WBC 12.6, AST 211, ALT 86. Normal lipase. Negative pregnancy test.   Imaging Studies ordered:  I ordered imaging studies including [place] RUQ  Interpretation of imaging pending.   Cardiac Monitoring:  The patient was maintained on a cardiac monitor.  I personally viewed and interpreted the cardiac monitored which showed an underlying rhythm of: NSR   Medicines ordered and prescription drug management:  I ordered medication including Fentanyl for pain, Reglan and Pepcid for nausea  I have reviewed the patients home medicines and have made adjustments as needed   Test Considered:  CT abd/pelvis   Problem List / ED Course:  Patient presenting for upper abdominal pain which began after eating.  She has a leukocytosis of 12.6 as well as transaminitis.  Question biliary pathology.  Pending right upper quadrant ultrasound.  Symptoms could also be consistent with acute episode of gastritis, though this would be less likely to result in LFT changes.  Her lipase is normal, excluding pancreatitis.  No concern for PSBO/SBO based on examination.  Doubt ruptured viscus.  No peritoneal signs.   Social Determinants of Health:  Good social support   Dispostion:  Care signed out to [place], [place] at shift change pending US imaging, symptom  management.         Final Clinical Impression(s) / ED Diagnoses Final diagnoses:  Pain of upper abdomen    Rx / DC Orders ED Discharge Orders     None         Antony Madura, PA-C 04/26/23 4010    Glynn Octave, MD 04/26/23 [phone]
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
## 2023-08-01 ENCOUNTER — Encounter (HOSPITAL_COMMUNITY): Payer: Self-pay

## 2023-08-01 ENCOUNTER — Emergency Department (HOSPITAL_COMMUNITY)
Admission: EM | Admit: 2023-08-01 | Discharge: 2023-08-02 | Disposition: A | Discharge status: Home / Self Care | Payer: BLUE CROSS/BLUE SHIELD | Attending: Emergency Medicine | Admitting: Emergency Medicine

## 2023-08-01 ENCOUNTER — Other Ambulatory Visit: Payer: Self-pay

## 2023-08-01 DIAGNOSIS — M7918 Myalgia, other site: Secondary | ICD-10-CM

## 2023-08-01 DIAGNOSIS — M542 Cervicalgia: Secondary | ICD-10-CM | POA: Diagnosis present

## 2023-08-01 DIAGNOSIS — Y92410 Unspecified street and highway as the place of occurrence of the external cause: Secondary | ICD-10-CM | POA: Insufficient documentation

## 2023-08-01 DIAGNOSIS — M25562 Pain in left knee: Secondary | ICD-10-CM | POA: Diagnosis not present

## 2023-08-01 DIAGNOSIS — M25561 Pain in right knee: Secondary | ICD-10-CM | POA: Insufficient documentation
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
## 2023-08-02 MED ORDER — CYCLOBENZAPRINE HCL 10 MG PO TABS: 10.0000 mg | ORAL_TABLET | Freq: Two times a day (BID) | ORAL | 0 refills | Status: DC | PRN

## 2023-08-02 MED ORDER — HYDROCODONE-ACETAMINOPHEN 5-325 MG PO TABS: 1.0000 | ORAL_TABLET | Freq: Four times a day (QID) | ORAL | 0 refills | Status: DC | PRN

## 2023-08-02 NOTE — Discharge Instructions (Signed)
Follow up with your doctor if neck soreness is not better in 3-4 days.
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
## 2023-09-19 ENCOUNTER — Encounter (HOSPITAL_COMMUNITY): Payer: Self-pay | Admitting: Emergency Medicine

## 2023-09-19 ENCOUNTER — Ambulatory Visit (HOSPITAL_COMMUNITY)
Admission: EM | Admit: 2023-09-19 | Discharge: 2023-09-19 | Disposition: A | Discharge status: Home / Self Care | Payer: BLUE CROSS/BLUE SHIELD | Attending: Emergency Medicine | Admitting: Emergency Medicine

## 2023-09-19 ENCOUNTER — Other Ambulatory Visit: Payer: Self-pay

## 2023-09-19 DIAGNOSIS — R1032 Left lower quadrant pain: Secondary | ICD-10-CM | POA: Insufficient documentation

## 2023-09-19 DIAGNOSIS — Z113 Encounter for screening for infections with a predominantly sexual mode of transmission: Secondary | ICD-10-CM | POA: Diagnosis present

## 2023-09-19 DIAGNOSIS — N898 Other specified noninflammatory disorders of vagina: Secondary | ICD-10-CM | POA: Insufficient documentation

## 2023-09-19 LAB — POCT URINALYSIS DIP (MANUAL ENTRY)
Bilirubin, UA: NEGATIVE
Glucose, UA: NEGATIVE mg/dL
Ketones, POC UA: NEGATIVE mg/dL
Leukocytes, UA: NEGATIVE
Nitrite, UA: NEGATIVE
Protein Ur, POC: NEGATIVE mg/dL
Spec Grav, UA: 1.025 (ref 1.010–1.025)
Urobilinogen, UA: 0.2 U/dL
pH, UA: 5.5 (ref 5.0–8.0)

## 2023-09-19 LAB — POCT URINE PREGNANCY: Preg Test, Ur: NEGATIVE

## 2023-09-19 LAB — HIV ANTIBODY (ROUTINE TESTING W REFLEX): HIV Screen 4th Generation wRfx: NONREACTIVE

## 2023-09-19 NOTE — ED Triage Notes (Signed)
 Abdominal pain for 2 days.  Slight vaginal discharge.  Patient also references a knot in groin area on the left side.

## 2023-09-19 NOTE — ED Triage Notes (Signed)
Requesting std testing
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
## 2023-09-20 LAB — CERVICOVAGINAL ANCILLARY ONLY
Bacterial Vaginitis (gardnerella): POSITIVE — AB
Candida Glabrata: NEGATIVE
Candida Vaginitis: NEGATIVE
Chlamydia: POSITIVE — AB
Comment: NEGATIVE
Comment: NEGATIVE
Comment: NEGATIVE
Comment: NEGATIVE
Comment: NEGATIVE
Comment: NORMAL
Neisseria Gonorrhea: NEGATIVE
Trichomonas: NEGATIVE

## 2023-09-20 LAB — URINE CULTURE: Culture: NO GROWTH

## 2023-09-20 LAB — RPR: RPR Ser Ql: NONREACTIVE

## 2023-09-21 ENCOUNTER — Telehealth: Payer: Self-pay

## 2023-09-21 MED ORDER — DOXYCYCLINE HYCLATE 100 MG PO TABS: 100.0000 mg | ORAL_TABLET | Freq: Two times a day (BID) | ORAL | 0 refills | Status: AC

## 2023-09-21 MED ORDER — METRONIDAZOLE 500 MG PO TABS: 500.0000 mg | ORAL_TABLET | Freq: Two times a day (BID) | ORAL | 0 refills | Status: AC

## 2023-09-21 MED ORDER — FLUCONAZOLE 150 MG PO TABS: 150.0000 mg | ORAL_TABLET | Freq: Once | ORAL | 0 refills | Status: AC

## 2023-09-21 NOTE — Addendum Note (Signed)
 Addended by: Warren Danes on: 09/21/2023 01:10 PM   Modules accepted: Orders

## 2023-09-21 NOTE — Telephone Encounter (Signed)
 Per protocol, pt requires tx with metronidazole and Doxycycline.  Attempted to reach patient x1. LVM.  Rx sent to pharmacy on file.

## 2023-09-21 NOTE — Telephone Encounter (Signed)
 Pt returned call. Requested Diflucan for abx- associated yeast infection. Sent per protocol.

## 2023-09-25 ENCOUNTER — Emergency Department (HOSPITAL_COMMUNITY): Payer: BLUE CROSS/BLUE SHIELD

## 2023-09-25 ENCOUNTER — Encounter (HOSPITAL_COMMUNITY): Payer: Self-pay | Admitting: *Deleted

## 2023-09-25 ENCOUNTER — Emergency Department (HOSPITAL_COMMUNITY)
Admission: EM | Admit: 2023-09-25 | Discharge: 2023-09-26 | Disposition: A | Discharge status: Home / Self Care | Payer: BLUE CROSS/BLUE SHIELD | Attending: Emergency Medicine | Admitting: Emergency Medicine

## 2023-09-25 ENCOUNTER — Other Ambulatory Visit: Payer: Self-pay

## 2023-09-25 DIAGNOSIS — S62623A Displaced fracture of medial phalanx of left middle finger, initial encounter for closed fracture: Secondary | ICD-10-CM | POA: Insufficient documentation

## 2023-09-25 DIAGNOSIS — S6992XA Unspecified injury of left wrist, hand and finger(s), initial encounter: Secondary | ICD-10-CM | POA: Diagnosis present

## 2023-09-25 DIAGNOSIS — S62635A Displaced fracture of distal phalanx of left ring finger, initial encounter for closed fracture: Secondary | ICD-10-CM

## 2023-09-25 DIAGNOSIS — X500XXA Overexertion from strenuous movement or load, initial encounter: Secondary | ICD-10-CM | POA: Diagnosis not present

## 2023-09-25 MED ORDER — LIDOCAINE HCL (PF) 1 % IJ SOLN
30.0000 mL | Freq: Once | INTRAMUSCULAR | Status: AC
  Administered 2023-09-25: 30 mL
  Filled 2023-09-25: qty 30
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
## 2023-09-25 NOTE — ED Provider Triage Note (Signed)
 Emergency Medicine Provider Triage Evaluation Note  Abigail Meyer , a 33 y.o. female  was evaluated in triage.  Pt complains of finger pain.  Review of Systems  Positive:  Negative:   Physical Exam  BP 102/65 (BP Location: Left Arm)   Pulse 76   Temp 98 F (36.7 C) (Oral)   Resp 18   Ht 5\' 2"  (1.575 m)   Wt 56.7 kg   LMP 09/07/2023   SpO2 98%   BMI 22.86 kg/m  Gen:   Awake, no distress   Resp:  Normal effort  MSK:   Moves extremities without difficulty  Other:    Medical Decision Making  Medically screening exam initiated at 8:39 PM.  Appropriate orders placed.  Abigail Meyer was informed that the remainder of the evaluation will be completed by another provider, this initial triage assessment does not replace that evaluation, and the importance of remaining in the ED until their evaluation is complete.  Left middle finger pain and swelling x8days after it was bent backwards.   Abigail Meyer, [place] 09/25/23 2039

## 2023-09-25 NOTE — ED Provider Notes (Incomplete)
  [place] [facility] AT [facility] Provider Note   CSN: [id number] Arrival date & time: 09/25/23  1933     History {Add pertinent medical, surgical, social history, OB history to HPI:1} Chief Complaint  Patient presents with  . Finger Injury    Abigail Meyer is a 33 y.o. female.  HPI     Home Medications Prior to Admission medications   Medication Sig Start Date End Date Taking? Authorizing Provider  doxycycline (VIBRA-TABS) 100 MG tablet Take 1 tablet (100 mg total) by mouth 2 (two) times daily for 7 days. 09/21/23 09/28/23  Zenia Resides, MD  metroNIDAZOLE (FLAGYL) 500 MG tablet Take 1 tablet (500 mg total) by mouth 2 (two) times daily for 7 days. 09/21/23 09/28/23  Zenia Resides, MD      Allergies    Patient has no known allergies.    Review of Systems   Review of Systems  Physical Exam Updated Vital Signs BP 102/65 (BP Location: Left Arm)   Pulse 76   Temp 98 F (36.7 C) (Oral)   Resp 18   Ht 5\' 2"  (1.575 m)   Wt 56.7 kg   LMP 09/07/2023   SpO2 98%   BMI 22.86 kg/m  Physical Exam  ED Results / Procedures / Treatments   Labs (all labs ordered are listed, but only abnormal results are displayed) Labs Reviewed - No data to display  EKG None  Radiology DG Finger Middle Left Result Date: 09/25/2023 CLINICAL DATA:  left middle finger injury 8 days ago, pain EXAM: LEFT MIDDLE FINGER 2+V COMPARISON:  None Available. FINDINGS: There is an oblique fracture noted through the middle phalanx of the left middle finger. Minimal displacement. No subluxation or dislocation. IMPRESSION: Minimally displaced oblique fracture through the middle phalanx of the left middle finger. Electronically Signed   By: Charlett Nose M.D.   On: 09/25/2023 20:55    Procedures Procedures  {Document cardiac monitor, telemetry assessment procedure when appropriate:1}  Medications Ordered in ED Medications  lidocaine (PF) (XYLOCAINE) 1 % injection 30 mL  (30 mLs Infiltration Given by Other 09/25/23 2353)    ED Course/ Medical Decision Making/ A&P   {   Click here for ABCD2, HEART and other calculatorsREFRESH Note before signing :1}                              Medical Decision Making Risk Prescription drug management.   ***  {Document critical care time when appropriate:1} {Document review of labs and clinical decision tools ie heart score, Chads2Vasc2 etc:1}  {Document your independent review of radiology images, and any outside records:1} {Document your discussion with family members, caretakers, and with consultants:1} {Document social determinants of health affecting pt's care:1} {Document your decision making why or why not admission, treatments were needed:1} Final Clinical Impression(s) / ED Diagnoses Final diagnoses:  None    Rx / DC Orders ED Discharge Orders     None
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
## 2023-09-26 MED ORDER — NAPROXEN 375 MG PO TABS: 375.0000 mg | ORAL_TABLET | Freq: Two times a day (BID) | ORAL | 0 refills | Status: DC

## 2023-09-26 NOTE — ED Notes (Signed)
 Pt upset about discharge medications, PA made aware of request for something stronger from pt. Pt informed of PA's decision. Pt refused VS. Ambulatory to waiting room.

## 2023-09-26 NOTE — Discharge Instructions (Signed)
 Contact a health care provider if: Your pain or swelling gets worse, even with treatment. You have trouble moving your finger. Get help right away if: Your finger gets numb or turns blue.

## 2023-11-12 ENCOUNTER — Ambulatory Visit (HOSPITAL_COMMUNITY)
Admission: EM | Admit: 2023-11-12 | Discharge: 2023-11-12 | Disposition: A | Discharge status: Home / Self Care | Payer: Self-pay | Attending: Emergency Medicine | Admitting: Emergency Medicine

## 2023-11-12 ENCOUNTER — Encounter (HOSPITAL_COMMUNITY): Payer: Self-pay | Admitting: Emergency Medicine

## 2023-11-12 DIAGNOSIS — N898 Other specified noninflammatory disorders of vagina: Secondary | ICD-10-CM | POA: Insufficient documentation

## 2023-11-12 DIAGNOSIS — Z3202 Encounter for pregnancy test, result negative: Secondary | ICD-10-CM

## 2023-11-12 LAB — POCT URINALYSIS DIP (MANUAL ENTRY)
Glucose, UA: NEGATIVE mg/dL
Leukocytes, UA: NEGATIVE
Nitrite, UA: NEGATIVE
Protein Ur, POC: 30 mg/dL — AB
Spec Grav, UA: >=1.03 — AB
Urobilinogen, UA: 0.2 U/dL
pH, UA: 5.5

## 2023-11-12 LAB — POCT URINE PREGNANCY: Preg Test, Ur: NEGATIVE

## 2023-11-12 MED ORDER — FLUCONAZOLE 150 MG PO TABS: ORAL_TABLET | ORAL | 0 refills | Status: DC
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
## 2023-11-12 NOTE — Discharge Instructions (Signed)
 Today have been screened for sexually transmitted infections as well as BV and yeast.  Results will be available via MyChart and our staff will contact you if treatment modification is needed.  Abstain from intercourse until results have been received.

## 2023-11-12 NOTE — ED Triage Notes (Signed)
 Pt c/o vaginal itching that started 5 days ago. Reports her menstrual ended today.

## 2023-11-16 ENCOUNTER — Telehealth (HOSPITAL_COMMUNITY): Payer: Self-pay

## 2023-11-16 LAB — CERVICOVAGINAL ANCILLARY ONLY
Bacterial Vaginitis (gardnerella): POSITIVE — AB
Candida Glabrata: NEGATIVE
Candida Vaginitis: NEGATIVE
Chlamydia: NEGATIVE
Comment: NEGATIVE
Comment: NEGATIVE
Comment: NEGATIVE
Comment: NEGATIVE
Comment: NEGATIVE
Comment: NORMAL
Neisseria Gonorrhea: NEGATIVE
Trichomonas: NEGATIVE

## 2023-11-16 MED ORDER — METRONIDAZOLE 500 MG PO TABS: 500.0000 mg | ORAL_TABLET | Freq: Two times a day (BID) | ORAL | 0 refills | Status: AC

## 2023-11-16 NOTE — Telephone Encounter (Signed)
 Per protocol, pt requires tx with metronidazole. Rx sent to pharmacy on file.

## 2023-12-18 ENCOUNTER — Other Ambulatory Visit: Payer: Self-pay

## 2023-12-18 ENCOUNTER — Encounter (HOSPITAL_COMMUNITY): Payer: Self-pay | Admitting: Emergency Medicine

## 2023-12-18 ENCOUNTER — Ambulatory Visit (HOSPITAL_COMMUNITY)
Admission: EM | Admit: 2023-12-18 | Discharge: 2023-12-18 | Disposition: A | Discharge status: Home / Self Care | Payer: Self-pay | Attending: Family Medicine | Admitting: Family Medicine

## 2023-12-18 DIAGNOSIS — N898 Other specified noninflammatory disorders of vagina: Secondary | ICD-10-CM | POA: Insufficient documentation

## 2023-12-18 DIAGNOSIS — Z202 Contact with and (suspected) exposure to infections with a predominantly sexual mode of transmission: Secondary | ICD-10-CM | POA: Insufficient documentation

## 2023-12-18 LAB — POCT URINALYSIS DIP (MANUAL ENTRY)
Bilirubin, UA: NEGATIVE
Glucose, UA: NEGATIVE mg/dL
Leukocytes, UA: NEGATIVE
Nitrite, UA: NEGATIVE
Spec Grav, UA: 1.025 (ref 1.010–1.025)
Urobilinogen, UA: 0.2 U/dL
pH, UA: 5.5 (ref 5.0–8.0)

## 2023-12-18 LAB — HIV ANTIBODY (ROUTINE TESTING W REFLEX): HIV Screen 4th Generation wRfx: NONREACTIVE

## 2023-12-18 NOTE — Discharge Instructions (Signed)
 Staff will notify you if there is anything positive on the swab or on the blood work.

## 2023-12-18 NOTE — ED Triage Notes (Signed)
 Pt is requesting to be check for std.
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
## 2023-12-19 LAB — CERVICOVAGINAL ANCILLARY ONLY
Bacterial Vaginitis (gardnerella): POSITIVE — AB
Candida Glabrata: NEGATIVE
Candida Vaginitis: POSITIVE — AB
Chlamydia: NEGATIVE
Comment: NEGATIVE
Comment: NEGATIVE
Comment: NEGATIVE
Comment: NEGATIVE
Comment: NEGATIVE
Comment: NORMAL
Neisseria Gonorrhea: NEGATIVE
Trichomonas: NEGATIVE

## 2023-12-19 LAB — RPR: RPR Ser Ql: NONREACTIVE

## 2023-12-20 ENCOUNTER — Ambulatory Visit (HOSPITAL_COMMUNITY): Payer: Self-pay

## 2023-12-20 MED ORDER — FLUCONAZOLE 150 MG PO TABS: 150.0000 mg | ORAL_TABLET | Freq: Once | ORAL | 0 refills | Status: AC

## 2024-03-08 ENCOUNTER — Encounter (HOSPITAL_COMMUNITY): Payer: Self-pay | Admitting: *Deleted

## 2024-03-08 ENCOUNTER — Ambulatory Visit (HOSPITAL_COMMUNITY)
Admission: EM | Admit: 2024-03-08 | Discharge: 2024-03-08 | Disposition: A | Discharge status: Home / Self Care | Payer: Self-pay | Attending: Family Medicine | Admitting: Family Medicine

## 2024-03-08 DIAGNOSIS — N760 Acute vaginitis: Secondary | ICD-10-CM | POA: Insufficient documentation

## 2024-03-08 MED ORDER — METRONIDAZOLE 500 MG PO TABS: 500.0000 mg | ORAL_TABLET | Freq: Two times a day (BID) | ORAL | 0 refills | Status: AC

## 2024-03-08 MED ORDER — FLUCONAZOLE 150 MG PO TABS: 150.0000 mg | ORAL_TABLET | ORAL | 0 refills | Status: AC

## 2024-03-08 NOTE — ED Triage Notes (Signed)
 Pt states she would like STI testing she is having a foul smell sometimes. She only wants cyto no blood work today.
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
## 2024-03-08 NOTE — Discharge Instructions (Addendum)
 Staff will notify you if there is anything positive on the swab (or on the blood work if that has been taken at this visit). It can take 2-3 days for the tests to result, depending on the day of the week your test was taken. You will only be notified if there are any positives on the testing; test results will also go to your MyChart if you are signed up for MyChart.   Take metronidazole  500 mg--1 tablet 2 times daily for 7 days.  Avoid drinking alcohol within 72 hours of taking this medication  Take fluconazole  150 mg--1 tablet every 3 days for 2 doses (take the first dose on about the 3rd or 4th day of the metronidazole  course, and then take 1 tablet 3 days later.)

## 2024-03-09 ENCOUNTER — Ambulatory Visit (HOSPITAL_COMMUNITY): Payer: Self-pay

## 2024-03-09 LAB — CERVICOVAGINAL ANCILLARY ONLY
Bacterial Vaginitis (gardnerella): POSITIVE — AB
Candida Glabrata: NEGATIVE
Candida Vaginitis: POSITIVE — AB
Chlamydia: NEGATIVE
Comment: NEGATIVE
Comment: NEGATIVE
Comment: NEGATIVE
Comment: NEGATIVE
Comment: NEGATIVE
Comment: NORMAL
Neisseria Gonorrhea: NEGATIVE
Trichomonas: NEGATIVE

## 2024-03-21 ENCOUNTER — Other Ambulatory Visit: Payer: Self-pay

## 2024-03-21 ENCOUNTER — Ambulatory Visit (HOSPITAL_COMMUNITY)
Admission: EM | Admit: 2024-03-21 | Discharge: 2024-03-21 | Disposition: A | Discharge status: Home / Self Care | Payer: Self-pay | Attending: Family Medicine | Admitting: Family Medicine

## 2024-03-21 ENCOUNTER — Encounter (HOSPITAL_COMMUNITY): Payer: Self-pay | Admitting: *Deleted

## 2024-03-21 DIAGNOSIS — N939 Abnormal uterine and vaginal bleeding, unspecified: Secondary | ICD-10-CM

## 2024-03-21 LAB — POCT URINE PREGNANCY: Preg Test, Ur: NEGATIVE

## 2024-03-21 MED ORDER — MEGESTROL ACETATE 40 MG PO TABS: 40.0000 mg | ORAL_TABLET | Freq: Two times a day (BID) | ORAL | 0 refills | Status: AC

## 2024-03-21 NOTE — ED Triage Notes (Signed)
 PT reports most recent period was 03/08/24 . Pt reports she has had vag bleeding for 3 days again. Pt reports she was given some pill the last time she came in.
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
